# Patient Record
Sex: Female | Born: 1975 | Race: White | Hispanic: No | Marital: Married | Smoking: Never smoker
Health system: Southern US, Community
[De-identification: ages and names within clinical notes are randomized; demographics above are authoritative.]

## PROBLEM LIST (undated history)

## (undated) DIAGNOSIS — F32A Depression, unspecified: Secondary | ICD-10-CM

## (undated) DIAGNOSIS — K589 Irritable bowel syndrome without diarrhea: Secondary | ICD-10-CM

## (undated) DIAGNOSIS — T4145XA Adverse effect of unspecified anesthetic, initial encounter: Secondary | ICD-10-CM

## (undated) DIAGNOSIS — F988 Other specified behavioral and emotional disorders with onset usually occurring in childhood and adolescence: Secondary | ICD-10-CM

## (undated) DIAGNOSIS — F329 Major depressive disorder, single episode, unspecified: Secondary | ICD-10-CM

## (undated) DIAGNOSIS — N649 Disorder of breast, unspecified: Secondary | ICD-10-CM

## (undated) DIAGNOSIS — T8859XA Other complications of anesthesia, initial encounter: Secondary | ICD-10-CM

## (undated) DIAGNOSIS — G43909 Migraine, unspecified, not intractable, without status migrainosus: Secondary | ICD-10-CM

## (undated) DIAGNOSIS — Z98811 Dental restoration status: Secondary | ICD-10-CM

## (undated) HISTORY — PX: BREAST EXCISIONAL BIOPSY: SUR124

## (undated) HISTORY — PX: POLYPECTOMY: SHX149

## (undated) HISTORY — DX: Irritable bowel syndrome, unspecified: K58.9

## (undated) HISTORY — DX: Migraine, unspecified, not intractable, without status migrainosus: G43.909

---

## 2001-07-04 ENCOUNTER — Other Ambulatory Visit: Admission: RE | Admit: 2001-07-04 | Discharge: 2001-07-04 | Payer: Self-pay | Admitting: Gynecology

## 2001-10-18 ENCOUNTER — Ambulatory Visit (HOSPITAL_COMMUNITY): Admission: RE | Admit: 2001-10-18 | Discharge: 2001-10-18 | Payer: Self-pay | Admitting: Gastroenterology

## 2001-11-02 ENCOUNTER — Other Ambulatory Visit: Admission: RE | Admit: 2001-11-02 | Discharge: 2001-11-02 | Payer: Self-pay | Admitting: Gynecology

## 2002-05-01 ENCOUNTER — Other Ambulatory Visit: Admission: RE | Admit: 2002-05-01 | Discharge: 2002-05-01 | Payer: Self-pay | Admitting: Gynecology

## 2002-09-20 ENCOUNTER — Ambulatory Visit (HOSPITAL_COMMUNITY): Admission: RE | Admit: 2002-09-20 | Discharge: 2002-09-20 | Payer: Self-pay | Admitting: Neurosurgery

## 2002-09-25 ENCOUNTER — Ambulatory Visit (HOSPITAL_COMMUNITY): Admission: RE | Admit: 2002-09-25 | Discharge: 2002-09-25 | Payer: Self-pay | Admitting: Neurosurgery

## 2002-11-20 ENCOUNTER — Other Ambulatory Visit: Admission: RE | Admit: 2002-11-20 | Discharge: 2002-11-20 | Payer: Self-pay | Admitting: Gynecology

## 2003-12-25 ENCOUNTER — Other Ambulatory Visit: Admission: RE | Admit: 2003-12-25 | Discharge: 2003-12-25 | Payer: Self-pay | Admitting: Gynecology

## 2004-12-17 ENCOUNTER — Other Ambulatory Visit: Admission: RE | Admit: 2004-12-17 | Discharge: 2004-12-17 | Payer: Self-pay | Admitting: Gynecology

## 2005-12-23 ENCOUNTER — Other Ambulatory Visit: Admission: RE | Admit: 2005-12-23 | Discharge: 2005-12-23 | Payer: Self-pay | Admitting: Gynecology

## 2006-05-06 ENCOUNTER — Ambulatory Visit (HOSPITAL_COMMUNITY): Admission: RE | Admit: 2006-05-06 | Discharge: 2006-05-06 | Payer: Self-pay | Admitting: Gynecology

## 2007-02-17 ENCOUNTER — Inpatient Hospital Stay (HOSPITAL_COMMUNITY): Admission: AD | Admit: 2007-02-17 | Discharge: 2007-02-27 | Payer: Self-pay | Admitting: Obstetrics and Gynecology

## 2007-02-18 ENCOUNTER — Encounter: Payer: Self-pay | Admitting: Student

## 2007-02-21 ENCOUNTER — Encounter: Payer: Self-pay | Admitting: Obstetrics and Gynecology

## 2007-02-22 ENCOUNTER — Encounter: Payer: Self-pay | Admitting: Obstetrics and Gynecology

## 2007-02-23 ENCOUNTER — Encounter: Payer: Self-pay | Admitting: Obstetrics and Gynecology

## 2007-02-24 ENCOUNTER — Encounter: Payer: Self-pay | Admitting: Obstetrics and Gynecology

## 2007-02-25 ENCOUNTER — Encounter (INDEPENDENT_AMBULATORY_CARE_PROVIDER_SITE_OTHER): Payer: Self-pay | Admitting: Specialist

## 2007-02-28 ENCOUNTER — Encounter: Admission: RE | Admit: 2007-02-28 | Discharge: 2007-03-23 | Payer: Self-pay | Admitting: Obstetrics and Gynecology

## 2008-11-16 HISTORY — PX: APPENDECTOMY: SHX54

## 2010-01-24 ENCOUNTER — Encounter: Payer: Self-pay | Admitting: Obstetrics and Gynecology

## 2010-01-24 ENCOUNTER — Observation Stay (HOSPITAL_COMMUNITY): Admission: EM | Admit: 2010-01-24 | Discharge: 2010-01-25 | Payer: Self-pay | Admitting: Surgery

## 2010-01-24 ENCOUNTER — Encounter (INDEPENDENT_AMBULATORY_CARE_PROVIDER_SITE_OTHER): Payer: Self-pay | Admitting: Surgery

## 2010-01-24 HISTORY — PX: LAPAROSCOPIC APPENDECTOMY: SHX408

## 2010-11-16 HISTORY — PX: COLONOSCOPY: SHX174

## 2010-12-07 ENCOUNTER — Encounter: Payer: Self-pay | Admitting: Obstetrics and Gynecology

## 2011-02-08 LAB — DIFFERENTIAL
Basophils Absolute: 0 10*3/uL (ref 0.0–0.1)
Basophils Relative: 0 % (ref 0–1)
Eosinophils Absolute: 0.1 10*3/uL (ref 0.0–0.7)
Eosinophils Relative: 1 % (ref 0–5)
Lymphocytes Relative: 12 % (ref 12–46)
Monocytes Absolute: 0.4 10*3/uL (ref 0.1–1.0)

## 2011-02-08 LAB — COMPREHENSIVE METABOLIC PANEL
Albumin: 4 g/dL (ref 3.5–5.2)
Alkaline Phosphatase: 54 U/L (ref 39–117)
BUN: 12 mg/dL (ref 6–23)
Calcium: 10 mg/dL (ref 8.4–10.5)
Potassium: 4.9 mEq/L (ref 3.5–5.1)
Sodium: 138 mEq/L (ref 135–145)
Total Protein: 6.8 g/dL (ref 6.0–8.3)

## 2011-02-08 LAB — CBC
HCT: 40.3 % (ref 36.0–46.0)
MCHC: 33.5 g/dL (ref 30.0–36.0)
Platelets: 303 10*3/uL (ref 150–400)
RDW: 12.1 % (ref 11.5–15.5)

## 2011-02-09 LAB — CBC
Hemoglobin: 12.6 g/dL (ref 12.0–15.0)
MCHC: 33.5 g/dL (ref 30.0–36.0)
Platelets: 251 10*3/uL (ref 150–400)
RDW: 12.3 % (ref 11.5–15.5)

## 2011-04-03 NOTE — Discharge Summary (Signed)
NAMEOVIDA, DELAGARZA               ACCOUNT NO.:  [id number]   MEDICAL RECORD NO.:  [id number]          PATIENT TYPE:  INP   LOCATION:  9317                          [facility]ACILITY:  [facility]   PHYSICIAN:  Juluis Mire, M.D.   DATE OF BIRTH:  [birth year]   DATE OF ADMISSION:  02/17/2007  DATE OF DISCHARGE:  02/27/2007                               DISCHARGE SUMMARY   ADMITTING DIAGNOSES:  1. Intrauterine pregnancy at 33-5/7 weeks estimated gestational age.  2. Marginal placenta previa with bleeding.  3. Intrauterine growth retardation.   DISCHARGE DIAGNOSES:  1. Status post low transverse cesarean section.  2. Viable female infant.   PROCEDURE:  Primary low transverse cesarean section.   REASON FOR ADMISSION:  Please see dictated H&P.   HOSPITAL COURSE:  The patient was a 36 year old white married female  primigravida that was admitted to [facility], with a  marginal placenta previa which was confirmed by ultrasound.  The patient  had had several ultrasounds following fetal growth.  It was noted at the  time of admission that fetal growth was rapidly following, now  approximately in the 6th percentile.  Mid-cerebral Doppler flow study an  umbilical or artery Doppler flow were normal, but range was decreasing.  Due to constellation of vaginal bleeding and IUGR, the patient was now  admitted for further observation, modified bed rest, steroid  administration and possible time delivery.  After several days of close  observation, amniocentesis was performed which revealed a mature LS  ratio, and PG was found.  Decision was made to proceed with a primary  low transverse cesarean section.  The patient was then transferred to  operating room where spinal anesthesia was administered without  difficulty.  A low transverse incision was made with delivery of a  viable female infant weighing 3 pounds 12 ounces, with Apgars of 8 at 5  minute.  Arterial cord pH was 7.35.  The patient  tolerated the procedure  well and was taken to the recovery room in stable condition.  On  postoperative day #1, the patient was without complaint.  Vital signs  were stable.  She is afebrile.  Fundus was firm and nontender.  Foley  had been discontinued.  She was voiding well.   LABORATORY FINDINGS:  Revealed hemoglobin of 10.3.  On postoperative day  #2, the patient was without complaint.   PHYSICAL EXAMINATION:  VITAL SIGNS:  Stable.  She was afebrile.  ABDOMEN:  Soft with good return of bowel function.  Fundus was firm and  nontender.  Incision was clean, dry and intact.   DISCHARGE INSTRUCTIONS:  Reviewed, and the patient was later discharged  home.   CONDITION ON DISCHARGE:  Stable.   DIET:  Regular as tolerated.   ACTIVITY:  No heavy lifting, no driving x2 weeks, no vaginal entry.   FOLLOW UP:  Patient to follow up in the office in 1 week for an incision  check.  She is to call for temperature greater than 100 degrees,  persistent nausea, vomiting, heavy vaginal bleeding and/or redness or  drainage from the incisional  site.   DISCHARGE MEDICATION:  1. Tylox x 30, one p.o. 4-6 hours.  2. Motrin 600 mg every 6 hours.  3. Prenatal vitamins one p.o. daily.  4. Colace one p.o. daily p.r.n.      Julio Sicks, N.P.      Juluis Mire, M.D.  Electronically Signed    CC/MEDQ  D:  04/01/2007  T:  04/01/2007  Job:  [id number]

## 2011-04-03 NOTE — H&P (Signed)
Denise [name], GORANSON NO.:  [id number]   MEDICAL RECORD NO.:  [id number]          PATIENT TYPE:  MAT   LOCATION:  MATC                          [facility]ILITY:  [facility]   PHYSICIAN:  Freddy Finner, M.D.   DATE OF BIRTH:  May 31, 1976   DATE OF ADMISSION:  02/17/2007  DATE OF DISCHARGE:                              HISTORY & PHYSICAL   ADMITTING DIAGNOSES:  1. Intrauterine pregnancy at 33-5/7th's weeks' gestation.  2. Marginal placenta previa with bleeding.   The patient is a 35 year old, white, married female, Primigravida, who  has been known to have a marginal placenta previa which persists through  the day of admission and is confirmed by ultrasound examination.  She  has had serial ultrasounds following fetal growth and fetal growth is  rapidly falling and has now reached the sixth percentile.  Middle  cerebral artery Doppler flow and umbilical artery Doppler flow are still  in the normal range but barely so.  Based on the constellation of the  vaginal bleeding and the fetal findings, it is elected to admit her now  for modified bed rest, careful fetal monitoring, steroid administration,  and timed delivery in 48-72 hours.   REVIEW OF SYSTEMS:  Negative except as noted above.  Her pregnancy has  been otherwise uncomplicated.   PAST MEDICAL HISTORY:  Outlined in detail in the prenatal summary.   PHYSICAL EXAMINATION:  GENERAL:  The patient is a well nourished, well  developed, young, white female in no acute distress at the time of  examination.  VITAL SIGNS:  Blood pressure in the office is 98/64.  NECK:  Thyroid gland is not palpably enlarged.  HEENT:  Grossly within normal limits.  CHEST:  Clear to auscultation.  HEART:  Normal sinus rhythm.  There is an audible early systolic murmur  at the second left intercostal space.  It is a grade 2-3/6 and has a  blowing character to auscultation.  Murmurs were not heard in any other  areas nor are there rubs or  gallops.  ABDOMEN:  Gravid and appropriate for gestational age with fundal height  34-cm.  EXTREMITIES:  Without cyanosis, clubbing, or edema.  PELVIC:  Deferred.   Ultrasound obtained in the office today essentially has the findings  noted in present illness.  The amniotic fluid index is in the 35th  percentile.  The baby's weight of 1576 grams is in the 6th percentile.  The baby did get 8 of 8 on a biophysical profile and a non-stress test  in the office and was reactive.   ASSESSMENT:  1. Intrauterine pregnancy.  2. Marginal placenta previa with bleeding.  3. Estimated gestational age of 54 and 5.   PLAN:  Again, as outlined above, i.e., admission for modified bedrest  for careful fetal monitoring for steroid administration with timed  delivery by cesarean.      Freddy Finner, M.D.  Electronically Signed     WRN/MEDQ  D:  02/17/2007  T:  02/17/2007  Job:  [id number]

## 2011-04-03 NOTE — Op Note (Signed)
[name], [name]               ACCOUNT NO.:  [id number]   MEDICAL RECORD NO.:  [id number]          PATIENT TYPE:  INP   LOCATION:  9155                          [facility]ILITY:  [facility]   PHYSICIAN:  Juluis Mire, M.D.   DATE OF BIRTH:  23-Oct-1976   DATE OF PROCEDURE:  02/25/2007  DATE OF DISCHARGE:                               OPERATIVE REPORT   PREOPERATIVE DIAGNOSES:  1. Intrauterine pregnancy at 34-6/7.  2. Intrauterine growth retardation.  3. Placenta previa.  Mature fetal pulmonary studies.   POSTOPERATIVE DIAGNOSES:  1. Intrauterine pregnancy at 34-6/7.  2. Intrauterine growth retardation.  3. Placenta previa.  Mature fetal pulmonary studies.   OPERATIVE PROCEDURE:  Primary low transverse cesarean section.   SURGEON:  Juluis Mire, M.D.   ANESTHESIA:  Was spinal.   ESTIMATED BLOOD LOSS:  600 mL.   PACKS AND DRAINS:  Were none.   INTRAOPERATIVE BLOOD REPLACED:  None.   COMPLICATIONS:  Were none.   INDICATIONS:  The patient is a 35 year old primigravida female of the  above gestational age.  She was hospitalized with evidence of  intrauterine growth retardation and a placenta previa.  She had an  amniocentesis performed yesterday with mature LS and PG and now presents  for primary cesarean section in view of the above-noted complications.  The risks were explained including the risk of infection.  The risk of  hemorrhage that could require transfusion with the risk of AIDS or  hepatitis.  Risk of injury to adjacent organs including bladder, bowel  or ureters that could require further exploratory surgery.  Risk of deep  venous thrombosis and pulmonary emboli.  The patient expressed  understanding of indications and risks.   PROCEDURE AS FOLLOWS:  The patient was taken to the OR and placed in the  supine position with the left lateral tilt.  After satisfactory level of  spinal anesthesia was obtained, the abdomen was prepped out with  Betadine and draped as a  sterile field.  A low transverse skin incision  was made with a knife and carried through the subcutaneous tissue.  Anterior rectus fascia was then sharply incised and the fascia extended  laterally.  Fascia was taken off the muscle superiorly and inferiorly.  Rectus muscles were separated in the midline.  The perineum was entered  sharply and incision of perineum extended both superiorly and  inferiorly.  Low transverse bladder flap was developed.  A low  transverse uterine incision was begun with a knife and extended  laterally using manual traction.  Amniotic fluid was clear.  There was  an anterior placenta previa.  Infant was delivered.  It was a viable  female.  Weight is pending.  Apgars were 8/8.  Umbilical artery pH was  7.35.  Placenta was delivered manually and sent for pathological review.  Uterus was exteriorized for closure.  There was no uterine anomalies.  Tubes and ovaries were unremarkable.  Uterus was then closed with a  running locking suture of zero chromic using a two-layer closure  technique.  We had good hemostasis and clear urine output.  The uterus  was returned to the abdominal cavity.  Pelvic cavity was irrigated.  Again, we had good hemostasis.  Muscles and peritoneum closed with a  running suture of 3-0 Vicryl.  Fascia closed with a suture of 0 PDS.  Skin was closed staples and Steri-Strips.  Sponge, instrument and needle  count reported as correct by circulating nurse x2.  Foley catheter  remained clear at time of closure.  The patient tolerated the procedure  and returned to recovery room in good condition.      Juluis Mire, M.D.  Electronically Signed     JSM/MEDQ  D:  02/25/2007  T:  02/25/2007  Job:  [id number]

## 2011-05-13 ENCOUNTER — Encounter: Payer: Self-pay | Admitting: Gastroenterology

## 2011-06-29 ENCOUNTER — Encounter: Payer: Self-pay | Admitting: Gastroenterology

## 2011-06-29 ENCOUNTER — Ambulatory Visit (INDEPENDENT_AMBULATORY_CARE_PROVIDER_SITE_OTHER): Payer: 59 | Admitting: Gastroenterology

## 2011-06-29 VITALS — BP 112/76 | HR 72 | Ht 67.0 in | Wt 192.2 lb

## 2011-06-29 DIAGNOSIS — K589 Irritable bowel syndrome without diarrhea: Secondary | ICD-10-CM

## 2011-06-29 DIAGNOSIS — R198 Other specified symptoms and signs involving the digestive system and abdomen: Secondary | ICD-10-CM

## 2011-06-29 MED ORDER — PEG-KCL-NACL-NASULF-NA ASC-C 100 G PO SOLR: 1.0000 | Freq: Once | ORAL | Status: DC

## 2011-06-29 NOTE — Patient Instructions (Signed)
You have been scheduled for a Colonoscopy with propofol. Separate instructions given.  Pick up your prep from your pharmacy.  High Fiber diet given.  cc: Creola Corn, MD

## 2011-06-29 NOTE — Progress Notes (Addendum)
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 She states she had a normal colonoscopy performed over 10 years ago as well. She notes that on her times of stress she can and will have urgent watery, nonbloody diarrhea associated with lower abdominal bloating and cramping. Since earlier this summer she has had a new symptom of constipation alternating with urgent diarrhea with similar lower abdominal bloating and pain. She tried fiber supplements with no change in symptoms. Her adopted sister passed away in 24-Sep-2023 at age 23 from breast cancer. She denies hematochezia, melena, weight loss, nausea, vomiting, upper abdominal pain, fevers, chills, rashes, joint symptoms. She was previously evaluated by Dr. Kinnie Scales. She last saw him in 2009. Blood work performed earlier this month and Dr. Ferd Hibbs office showed elevated cholesterol and triglycerides. Her CBC, chemistry panel, urinalysis and TSH were unremarkable  Past Medical History  Diagnosis Date  . IBS (irritable bowel syndrome)   . ADD (attention deficit disorder with hyperactivity)   . Anxiety   . Hyperlipidemia   . Anorectal fissure   . Overweight   . Migraines   . Hypertension    Past Surgical History  Procedure Date  . Cesarean section   . Appendectomy     reports that she has never smoked. She has never used smokeless tobacco. She reports that she drinks alcohol. She reports that she does not use illicit drugs. family history includes Aneurysm in her paternal uncle; Cerebral aneurysm in her maternal grandfather and mother; Heart disease in her maternal grandfather; Nephrolithiasis in her father; and Osteoarthritis in her mother. No Known Allergies  Outpatient Encounter Prescriptions as of 06/29/2011  Medication Sig Dispense Refill  . DULoxetine (CYMBALTA) 60 MG capsule Take 60 mg by mouth daily.        . fluticasone (FLONASE) 50  MCG/ACT nasal spray Place 2 sprays into the nose daily.        Marland Kitchen lamoTRIgine (LAMICTAL) 200 MG tablet Take 200 mg by mouth daily.        Marland Kitchen lisdexamfetamine (VYVANSE) 60 MG capsule Take 60 mg by mouth every morning.        . Norethindrone Acet-Ethinyl Est (LOESTRIN 1/20, 21, PO) Take by mouth as directed.        . peg 3350 powder (MOVIPREP) 100 G SOLR Take 1 kit (100 g total) by mouth once.  1 kit  0  . DISCONTD: clonazePAM (KLONOPIN) 0.5 MG tablet Take 0.5 mg by mouth as needed.           Review of Systems: Pertinent positive and negative review of systems were noted in the above HPI section. All other review of systems were otherwise negative.  Physical Exam: General: Well developed , well nourished, no acute distress Head: Normocephalic and atraumatic Eyes:  sclerae anicteric, EOMI Ears: Normal auditory acuity Mouth: No deformity or lesions Neck: Supple, no masses or thyromegaly Lungs: Clear throughout to auscultation Heart: Regular rate and rhythm; no murmurs, rubs or bruits Abdomen: Soft, non tender and non distended. No masses, hepatosplenomegaly or hernias noted. Normal Bowel sounds Rectal: Deferred to colonoscopy Musculoskeletal: Symmetrical with no gross deformities  Skin: No lesions on visible extremities Pulses:  Normal pulses noted Extremities: No clubbing, cyanosis, edema or deformities noted Neurological: Alert oriented x 4, grossly nonfocal Cervical Nodes:  No significant cervical adenopathy Inguinal Nodes: No significant inguinal adenopathy Psychological:  Alert and cooperative. Normal mood and affect  Assessment and Recommendations:  1. Change in bowel habits with alternating diarrhea and constipation in the setting of a history of diarrhea predominant irritable bowel syndrome. I suspect this is a new phase of her irritable bowel syndrome however colitis and colorectal neoplasms need to be excluded with colonoscopy. Increase dietary fiber and water intake. Daily fiber  supplement. MiraLax once or twice daily titrated to avoid constipation. The risks, benefits, and alternatives to colonoscopy with possible biopsy and possible polypectomy were discussed with the patient and they consent to proceed. Consider the addition of a probiotic and/or anti-spasmodic if needed. Will use propofol sedation given use of a Adderall, Cymbalta and Vyvance.

## 2011-07-01 ENCOUNTER — Ambulatory Visit (AMBULATORY_SURGERY_CENTER): Payer: 59 | Admitting: Gastroenterology

## 2011-07-01 ENCOUNTER — Encounter: Payer: Self-pay | Admitting: Gastroenterology

## 2011-07-01 VITALS — BP 114/7 | HR 66 | Temp 98.9°F | Resp 18 | Ht 67.0 in | Wt 192.0 lb

## 2011-07-01 DIAGNOSIS — D126 Benign neoplasm of colon, unspecified: Secondary | ICD-10-CM

## 2011-07-01 DIAGNOSIS — R198 Other specified symptoms and signs involving the digestive system and abdomen: Secondary | ICD-10-CM

## 2011-07-01 DIAGNOSIS — K589 Irritable bowel syndrome without diarrhea: Secondary | ICD-10-CM

## 2011-07-01 DIAGNOSIS — K648 Other hemorrhoids: Secondary | ICD-10-CM

## 2011-07-01 MED ORDER — SODIUM CHLORIDE 0.9 % IV SOLN: 500.0000 mL | INTRAVENOUS | Status: DC

## 2011-07-01 NOTE — Patient Instructions (Signed)
Discharge instructions given with verbal understanding. Handouts on polyps,hemorrhoids and a high fiber diet given. Resume previous medications. 

## 2011-07-02 ENCOUNTER — Telehealth: Payer: Self-pay | Admitting: *Deleted

## 2011-07-02 NOTE — Telephone Encounter (Signed)

## 2011-07-07 ENCOUNTER — Encounter: Payer: Self-pay | Admitting: Gastroenterology

## 2011-07-29 ENCOUNTER — Encounter: Payer: Self-pay | Admitting: *Deleted

## 2011-08-03 ENCOUNTER — Telehealth: Payer: Self-pay | Admitting: Gastroenterology

## 2011-08-03 ENCOUNTER — Ambulatory Visit: Payer: 59 | Admitting: Gastroenterology

## 2011-08-03 NOTE — Telephone Encounter (Signed)
No charge. 

## 2011-09-08 ENCOUNTER — Ambulatory Visit: Payer: 59 | Admitting: Gastroenterology

## 2013-09-28 ENCOUNTER — Other Ambulatory Visit: Payer: Self-pay | Admitting: Obstetrics and Gynecology

## 2013-09-28 DIAGNOSIS — R922 Inconclusive mammogram: Secondary | ICD-10-CM

## 2013-09-28 DIAGNOSIS — Z803 Family history of malignant neoplasm of breast: Secondary | ICD-10-CM

## 2013-10-14 ENCOUNTER — Ambulatory Visit
Admission: RE | Admit: 2013-10-14 | Discharge: 2013-10-14 | Disposition: A | Discharge status: Home / Self Care | Payer: 59 | Source: Ambulatory Visit | Attending: Obstetrics and Gynecology | Admitting: Obstetrics and Gynecology

## 2013-10-14 DIAGNOSIS — R922 Inconclusive mammogram: Secondary | ICD-10-CM

## 2013-10-14 DIAGNOSIS — Z803 Family history of malignant neoplasm of breast: Secondary | ICD-10-CM

## 2013-10-14 MED ORDER — GADOBENATE DIMEGLUMINE 529 MG/ML IV SOLN
18.0000 mL | Freq: Once | INTRAVENOUS | Status: AC | PRN
  Administered 2013-10-14: 18 mL via INTRAVENOUS

## 2013-10-17 ENCOUNTER — Other Ambulatory Visit: Payer: Self-pay | Admitting: Obstetrics and Gynecology

## 2013-10-17 DIAGNOSIS — R928 Other abnormal and inconclusive findings on diagnostic imaging of breast: Secondary | ICD-10-CM

## 2013-10-18 ENCOUNTER — Ambulatory Visit
Admission: RE | Admit: 2013-10-18 | Discharge: 2013-10-18 | Disposition: A | Discharge status: Home / Self Care | Payer: 59 | Source: Ambulatory Visit | Attending: Obstetrics and Gynecology | Admitting: Obstetrics and Gynecology

## 2013-10-18 ENCOUNTER — Other Ambulatory Visit: Payer: Self-pay | Admitting: Obstetrics and Gynecology

## 2013-10-18 DIAGNOSIS — R928 Other abnormal and inconclusive findings on diagnostic imaging of breast: Secondary | ICD-10-CM

## 2013-10-19 ENCOUNTER — Other Ambulatory Visit: Payer: Self-pay | Admitting: Obstetrics and Gynecology

## 2013-10-19 DIAGNOSIS — R928 Other abnormal and inconclusive findings on diagnostic imaging of breast: Secondary | ICD-10-CM

## 2013-10-24 ENCOUNTER — Ambulatory Visit
Admission: RE | Admit: 2013-10-24 | Discharge: 2013-10-24 | Disposition: A | Discharge status: Home / Self Care | Payer: 59 | Source: Ambulatory Visit | Attending: Obstetrics and Gynecology | Admitting: Obstetrics and Gynecology

## 2013-10-24 DIAGNOSIS — R928 Other abnormal and inconclusive findings on diagnostic imaging of breast: Secondary | ICD-10-CM

## 2013-10-24 MED ORDER — GADOBENATE DIMEGLUMINE 529 MG/ML IV SOLN
18.0000 mL | Freq: Once | INTRAVENOUS | Status: AC | PRN
  Administered 2013-10-24: 18 mL via INTRAVENOUS

## 2014-09-24 ENCOUNTER — Other Ambulatory Visit: Payer: Self-pay | Admitting: Obstetrics and Gynecology

## 2014-09-25 ENCOUNTER — Other Ambulatory Visit: Payer: Self-pay | Admitting: Obstetrics and Gynecology

## 2014-09-25 LAB — CYTOLOGY - PAP

## 2014-10-04 ENCOUNTER — Other Ambulatory Visit: Payer: Self-pay | Admitting: Obstetrics and Gynecology

## 2014-10-04 DIAGNOSIS — Z1231 Encounter for screening mammogram for malignant neoplasm of breast: Secondary | ICD-10-CM

## 2015-12-03 ENCOUNTER — Ambulatory Visit
Admission: RE | Admit: 2015-12-03 | Discharge: 2015-12-03 | Disposition: A | Discharge status: Home / Self Care | Payer: 59 | Source: Ambulatory Visit | Attending: Obstetrics and Gynecology | Admitting: Obstetrics and Gynecology

## 2015-12-03 DIAGNOSIS — Z1231 Encounter for screening mammogram for malignant neoplasm of breast: Secondary | ICD-10-CM

## 2015-12-05 ENCOUNTER — Other Ambulatory Visit: Payer: Self-pay | Admitting: Obstetrics and Gynecology

## 2015-12-05 DIAGNOSIS — Z803 Family history of malignant neoplasm of breast: Secondary | ICD-10-CM

## 2015-12-09 ENCOUNTER — Encounter: Payer: Self-pay | Admitting: Neurology

## 2015-12-09 ENCOUNTER — Ambulatory Visit (INDEPENDENT_AMBULATORY_CARE_PROVIDER_SITE_OTHER): Payer: 59 | Admitting: Neurology

## 2015-12-09 VITALS — BP 159/84 | HR 89 | Ht 67.0 in | Wt 224.6 lb

## 2015-12-09 DIAGNOSIS — H8149 Vertigo of central origin, unspecified ear: Secondary | ICD-10-CM | POA: Diagnosis not present

## 2015-12-09 DIAGNOSIS — H814 Vertigo of central origin: Secondary | ICD-10-CM

## 2015-12-09 DIAGNOSIS — R51 Headache: Secondary | ICD-10-CM

## 2015-12-09 DIAGNOSIS — E669 Obesity, unspecified: Secondary | ICD-10-CM | POA: Insufficient documentation

## 2015-12-09 DIAGNOSIS — R110 Nausea: Secondary | ICD-10-CM | POA: Diagnosis not present

## 2015-12-09 DIAGNOSIS — G43909 Migraine, unspecified, not intractable, without status migrainosus: Secondary | ICD-10-CM | POA: Diagnosis not present

## 2015-12-09 DIAGNOSIS — E785 Hyperlipidemia, unspecified: Secondary | ICD-10-CM

## 2015-12-09 DIAGNOSIS — R42 Dizziness and giddiness: Secondary | ICD-10-CM

## 2015-12-09 DIAGNOSIS — G4489 Other headache syndrome: Secondary | ICD-10-CM

## 2015-12-09 DIAGNOSIS — R2689 Other abnormalities of gait and mobility: Secondary | ICD-10-CM | POA: Insufficient documentation

## 2015-12-09 DIAGNOSIS — H539 Unspecified visual disturbance: Secondary | ICD-10-CM | POA: Diagnosis not present

## 2015-12-09 DIAGNOSIS — R519 Headache, unspecified: Secondary | ICD-10-CM | POA: Insufficient documentation

## 2015-12-09 MED ORDER — ONDANSETRON 4 MG PO TBDP: 4.0000 mg | ORAL_TABLET | Freq: Three times a day (TID) | ORAL | Status: DC | PRN

## 2015-12-09 MED ORDER — SUMATRIPTAN SUCCINATE 11 MG/NOSEPC NA EXHP: 2.0000 | INHALANT_POWDER | Freq: Once | NASAL | Status: AC

## 2015-12-09 NOTE — Progress Notes (Signed)
Faxed onzetra enrollment form plus rx onzetra to [facility]. Received confirmation. Fax: [phone]. Sent copy to MR.

## 2015-12-09 NOTE — Patient Instructions (Addendum)
Overall you are doing fairly well but I do want to suggest a few things today:   Remember to drink plenty of fluid, eat healthy meals and do not skip any meals. Try to eat protein with a every meal and eat a healthy snack such as fruit or nuts in between meals. Try to keep a regular sleep-wake schedule and try to exercise daily, particularly in the form of walking, 20-30 minutes a day, if you can.   As far as your medications are concerned, I would like to suggest:  Onzetra at inset if headache or vertigo. May repeat in 2 hours. Zofran as needed for nausea and vertigo  As far as diagnostic testing: MRi of the brain  Our phone number is [phone]. We also have an after hours call service for urgent matters and there is a physician on-call for urgent questions. For any emergencies you know to call 911 or go to the nearest emergency room

## 2015-12-09 NOTE — Progress Notes (Signed)
[facility]    Provider:  Dr Jaynee Eagles Referring Provider: Arvella Nigh Primary Care Physician:  Precious Reel, MD  CC:  Migraines and vertigo  HPI:  Denise [name] is a 40 y.o. female here as a referral from Dr. Virgina Jock for vertigo and migraines. PMHx IBS, ADD, anxiety, HLD, obesity, HTN. Migraines started years ago. She has had 1-2 in the last 3-4 months. They are not that often. But she has significant vision changes, she loses her peripheral vision. She started having vertigo lately. She has a FHx of brain aneurysm. Mother was hospitalized for 6 weeks after a ruptrue. Grandfather also had a stroke, dad's father and dad's brother had a stroke at a young age. She had an MRI of the brain 10 years ago and they looked for aneurysm and she had a cerebral angiorgram which was negative. Vertigo started in November and is worsening. Was coming and going, wasn't related to menstrual cycle like her migraines are. Would last a few hours, room spinning and nausea. Could be anytime of the day. Unknown if associated with head movements but she doesn't think so. At [place] she all of sudden felt like she was going to topple over, everything went spinning, nausea without associated headache. No preceding viruses or illnesses or ear infections prior to onset. She lays down and goes to sleep which helps, quiet room, has to sit still, if she moves it spins. No decreased hearing in the ears or fullness in the ears.  She is having it every other week. Eyes will feel like they are moving with vision changes, she feels tired afterwards and nauseated. No known triggers. Blood pressure today is a little high but usually normal. No Hx of HTN, diabetes, smoking, alcohol. She has some elevated cholesterol and she just started a diet. No new medications. She feels nystagmus in the eyes.   Reviewed notes, labs and imaging from outside physicians, which showed:  IR Angiogram 2003: 1. NO ANGIOGRAPHIC EVIDENCE OF  INTRACRANIAL OR EXTRACRANIAL ANEURYSMS. 2. NO EVIDENCE OF DISSECTIONS, VASOSPASM, OCCLUSIONS, OR STENOSES NOTED EITHER. 3. NO EVIDENCE DURAL ARTERIOVENOUS MALFORMATIONS OR ARTERIOVENOUS MALFORMATIONS.  Reviewed records from obgyn. 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 Has Hx of migraines with aura. Also with IBS. Husband had vasectomy. She was referred to neurology for vertigo.   Last cmp I have available in 2011 is normal except for elevated glucose 148.  Review of Systems: Patient complains of symptoms per HPI as well as the following symptoms:headache, dizziness, depression . Pertinent negatives per HPI. All others negative.   Social History   Social History  . Marital Status: Married    Spouse Name: N/A  . Number of Children: 1  . Years of Education: N/A   Occupational History  . Child psychotherapist @ [employer]    Social History Main Topics  . Smoking status: Never Smoker   . Smokeless tobacco: Never Used  . Alcohol Use: Yes     Comment: 1 glass of wine daily  . Drug Use: No  . Sexual Activity: Not on file   Other Topics Concern  . Not on file   Social History Narrative  . No narrative on file    Family History  Problem Relation Age of Onset  . Nephrolithiasis Father   . Cerebral aneurysm Mother   . Osteoarthritis Mother   . Cerebral aneurysm Maternal Grandfather   . Heart disease Maternal Grandfather   .  Aneurysm Paternal Uncle   . Colon cancer Neg Hx     Past Medical History  Diagnosis Date  . IBS (irritable bowel syndrome)   . ADD (attention deficit disorder with hyperactivity)   . Anxiety   . Hyperlipidemia   . Anorectal fissure   . Overweight   . Migraines   . Hypertension   . Personal history of colonic polyps 07/01/2011    SERRATED ADENOMA  . Internal hemorrhoids     Past Surgical History  Procedure Laterality Date  . Cesarean section    . Appendectomy      Current Outpatient  Prescriptions  Medication Sig Dispense Refill  . DULoxetine (CYMBALTA) 60 MG capsule Take 30 mg by mouth daily.     Marland Kitchen lamoTRIgine (LAMICTAL) 200 MG tablet Take 200 mg by mouth daily.      . Norethindrone Acet-Ethinyl Est (LOESTRIN 1/20, 21, PO) Take by mouth as directed.       No current facility-administered medications for this visit.    Allergies as of 12/09/2015  . (No Known Allergies)    Vitals: BP 159/84 mmHg  Pulse 89  Ht 5\' 7"  (1.702 m)  Wt 224 lb 9.6 oz (101.878 kg)  BMI 35.17 kg/m2 Last Weight:  Wt Readings from Last 1 Encounters:  12/09/15 224 lb 9.6 oz (101.878 kg)   Last Height:   Ht Readings from Last 1 Encounters:  12/09/15 5\' 7"  (1.702 m)     Physical exam: Exam: Gen: NAD, conversant, well nourised, obese, well groomed                     CV: RRR, no MRG. No Carotid Bruits. No peripheral edema, warm, nontender Eyes: Conjunctivae clear without exudates or hemorrhage  Neuro: Detailed Neurologic Exam  Speech:    Speech is normal; fluent and spontaneous with normal comprehension.  Cognition:    The patient is oriented to person, place, and time;     recent and remote memory intact;     language fluent;     normal attention, concentration,     fund of knowledge Cranial Nerves:    The pupils are equal, round, and reactive to light. The fundi are normal and spontaneous venous pulsations are present. Visual fields are full to finger confrontation. Extraocular movements are intact. Trigeminal sensation is intact and the muscles of mastication are normal. The face is symmetric. The palate elevates in the midline. Hearing intact. Voice is normal. Shoulder shrug is normal. The tongue has normal motion without fasciculations.   Coordination:    Normal finger to nose and heel to shin. Normal rapid alternating movements.   Gait:    Heel-toe and tandem gait are normal.   Motor Observation:    No asymmetry, no atrophy, and no involuntary movements  noted. Tone:    Normal muscle tone.    Posture:    Posture is normal. normal erect    Strength:    Strength is V/V in the upper and lower limbs.      Sensation: intact to LT     Reflex Exam:  DTR's:    Deep tendon reflexes in the upper and lower extremities are normal bilaterally.   Toes:    The toes are downgoing bilaterally.   Clonus:    Clonus is absent.     Assessment/Plan:   40 y.o. female here as a referral from Dr. Virgina Jock for migraines and vertigo. PMHx IBS, ADD, anxiety, HLD, obesity, HTN. Migraines started years  ago. She has had 1-2 in the last 3-4 months. They are not that often. But she has significant vision changes, she loses her peripheral vision. She started having vertigo as well, worsening, not with movements of the head, sometimes associated with headache. May consider vestibular migraine.  Need to rule out central cause of vertigo or a vestibular schwannoma, cerebellar infarct and for pathologies affecting the brainstem  MRi of the brain CMP Will start Onzetra(triptan) for acute management of migraines and vertiginous episodes. May repeat in 2 hours. The most common side-effects are feeling sick (nausea), dizziness and dry mouth. In addition, triptans can also cause some people to experience strange sensations. These may be a tightness, tingling, flushing, and feelings of heaviness or pressure in areas such as the face and limbs, and occasionally the chest. Serious side effects can include stroke, cardiac side effects such as chest tightness, shortness of breath and possible cardiovascular adverse effects. Zofran for nausea  CC: Dr. Radene Knee and Dr. Baird Cancer, [facility] [address] [place],  [address]  Phone [phone] Fax [phone]

## 2015-12-16 ENCOUNTER — Encounter: Payer: Self-pay | Admitting: *Deleted

## 2015-12-16 NOTE — Progress Notes (Signed)
Faxed recent office notes per request from [facility] support) to process PA for insurance reasons. Received confirmation.

## 2015-12-17 ENCOUNTER — Telehealth: Payer: Self-pay | Admitting: Neurology

## 2015-12-17 DIAGNOSIS — F40240 Claustrophobia: Secondary | ICD-10-CM

## 2015-12-17 MED ORDER — ALPRAZOLAM 0.5 MG PO TABS: ORAL_TABLET | ORAL | Status: DC

## 2015-12-17 NOTE — Telephone Encounter (Signed)
LVM returning call from pt. Asked her to call back so I can verify her allergies. Advised we typically give xanax 0.5mg  (3tablets). She can take 1-2 tablets prior to scan and take additional dose at time of test if needed. Gave [facility] phone number.

## 2015-12-17 NOTE — Telephone Encounter (Signed)
Pt called and wants to know if she could get a one time dose of an antianxiety medication. She is going to have and MRI and a breast scan done on the same day, Monday Feb 6th. Please send to [facility] on [address]. Please call and advise [phone]

## 2015-12-17 NOTE — Telephone Encounter (Signed)
Faxed printed rx to pharmacy. Received confirmation.

## 2015-12-19 NOTE — Addendum Note (Signed)
Addended by: Rossie Muskrat L on: 12/19/2015 12:20 PM   Modules accepted: Medications

## 2015-12-19 NOTE — Progress Notes (Signed)
Notice of approval received from [facility] for onzetra. Approval for 12/17/15-06/15/16.

## 2015-12-20 ENCOUNTER — Other Ambulatory Visit: Payer: 59

## 2015-12-23 ENCOUNTER — Inpatient Hospital Stay: Admission: RE | Admit: 2015-12-23 | Payer: 59 | Source: Ambulatory Visit

## 2015-12-23 ENCOUNTER — Other Ambulatory Visit: Payer: 59

## 2015-12-24 ENCOUNTER — Ambulatory Visit
Admission: RE | Admit: 2015-12-24 | Discharge: 2015-12-24 | Disposition: A | Discharge status: Home / Self Care | Payer: 59 | Source: Ambulatory Visit | Attending: Obstetrics and Gynecology | Admitting: Obstetrics and Gynecology

## 2015-12-24 ENCOUNTER — Ambulatory Visit
Admission: RE | Admit: 2015-12-24 | Discharge: 2015-12-24 | Disposition: A | Discharge status: Home / Self Care | Payer: 59 | Source: Ambulatory Visit | Attending: Neurology | Admitting: Neurology

## 2015-12-24 DIAGNOSIS — G4489 Other headache syndrome: Secondary | ICD-10-CM

## 2015-12-24 DIAGNOSIS — R2689 Other abnormalities of gait and mobility: Secondary | ICD-10-CM

## 2015-12-24 DIAGNOSIS — H814 Vertigo of central origin: Secondary | ICD-10-CM

## 2015-12-24 DIAGNOSIS — E785 Hyperlipidemia, unspecified: Secondary | ICD-10-CM

## 2015-12-24 DIAGNOSIS — E669 Obesity, unspecified: Secondary | ICD-10-CM

## 2015-12-24 DIAGNOSIS — R42 Dizziness and giddiness: Secondary | ICD-10-CM

## 2015-12-24 DIAGNOSIS — Z803 Family history of malignant neoplasm of breast: Secondary | ICD-10-CM

## 2015-12-24 DIAGNOSIS — H539 Unspecified visual disturbance: Secondary | ICD-10-CM

## 2015-12-24 MED ORDER — GADOBENATE DIMEGLUMINE 529 MG/ML IV SOLN
20.0000 mL | Freq: Once | INTRAVENOUS | Status: AC | PRN
  Administered 2015-12-24: 20 mL via INTRAVENOUS

## 2015-12-24 MED ORDER — GADOBENATE DIMEGLUMINE 529 MG/ML IV SOLN
20.0000 mL | Freq: Once | INTRAVENOUS | Status: DC | PRN
Start: 2015-12-24 — End: 2015-12-25

## 2015-12-27 ENCOUNTER — Telehealth: Payer: Self-pay | Admitting: *Deleted

## 2015-12-27 NOTE — Telephone Encounter (Signed)
-----   Message from Melvenia Beam, MD sent at 12/27/2015  8:54 AM EST ----- Terrence Dupont, patient's MRI is essential normal. The back of her brain sits just barely a little lower in the skull by 3-24mm. 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 There is no effect on the brain and her brain is normal. Findings this minor are unlikely to cause any symptoms at all. She is welcome to come into the office so I can show her in person thanks.

## 2015-12-27 NOTE — Telephone Encounter (Signed)
LVM for pt to call about results. Gave [facility] phone number and hours.  

## 2015-12-30 ENCOUNTER — Other Ambulatory Visit: Payer: Self-pay | Admitting: Obstetrics and Gynecology

## 2015-12-30 DIAGNOSIS — R928 Other abnormal and inconclusive findings on diagnostic imaging of breast: Secondary | ICD-10-CM

## 2015-12-30 NOTE — Telephone Encounter (Signed)
Called pt and relayed results per Dr Jaynee Eagles note. Pt verbalized understanding. She would like to have an old MRI compared to her new one. Advised she will have to obtain a copy and drop off at our office for one of the providers here to compare imaging. She verbalized understanding. She declined coming in to review images at this time. She is going to wait until May when she has a f/u to go over images.

## 2015-12-30 NOTE — Telephone Encounter (Signed)
Patient is returning your call about her MRI.  Please call.  Thanks!

## 2016-01-02 ENCOUNTER — Ambulatory Visit
Admission: RE | Admit: 2016-01-02 | Discharge: 2016-01-02 | Disposition: A | Discharge status: Home / Self Care | Payer: 59 | Source: Ambulatory Visit | Attending: Obstetrics and Gynecology | Admitting: Obstetrics and Gynecology

## 2016-01-02 ENCOUNTER — Other Ambulatory Visit: Payer: Self-pay | Admitting: Obstetrics and Gynecology

## 2016-01-02 DIAGNOSIS — R928 Other abnormal and inconclusive findings on diagnostic imaging of breast: Secondary | ICD-10-CM

## 2016-01-02 MED ORDER — GADOBENATE DIMEGLUMINE 529 MG/ML IV SOLN
20.0000 mL | Freq: Once | INTRAVENOUS | Status: AC | PRN
  Administered 2016-01-02: 20 mL via INTRAVENOUS

## 2016-03-17 ENCOUNTER — Ambulatory Visit (INDEPENDENT_AMBULATORY_CARE_PROVIDER_SITE_OTHER): Payer: 59 | Admitting: Neurology

## 2016-03-17 VITALS — BP 137/80 | HR 83 | Ht 67.0 in | Wt 201.2 lb

## 2016-03-17 DIAGNOSIS — G43109 Migraine with aura, not intractable, without status migrainosus: Secondary | ICD-10-CM

## 2016-03-17 NOTE — Progress Notes (Signed)
[facility]    Provider:  Dr Jaynee Eagles Referring Provider: Shon Baton, MD Primary Care Physician:  Precious Reel, MD   CC: Migraines and vertigo  Interval history 03/17/2016: CARLON TROLINGER is a 40 y.o. female here as a referral from Dr. Virgina Jock for vertigo and migraines. PMHx IBS, ADD, anxiety, HLD, obesity, HTN. The onzentra worked. She has had 3 events since last being seen. They started with vertigo, dizziness and nausea. She took the [place] and it quickly worked, initially it burned throat but after that no side effects. She has not noticed any triggers. [place]hart diet and she lost 32 pounds.   MRI of the brain was unremarkable 12/24/2015:  No abnormal lesions are seen on diffusion-weighted views to suggest acute ischemia. The cortical sulci, fissures and cisterns are normal in size and appearance. Lateral, third and fourth ventricle are normal in size and appearance. No extra-axial fluid collections are seen. No evidence of mass effect or midline shift. No abnormal lesions are seen on post contrast views.   On sagittal views the posterior fossa, pituitary gland and corpus callosum are unremarkable. Minimal downward cerebellar tonsillar ectopia (3-71mm). No evidence of intracranial hemorrhage on SWI views. The orbits and their contents, paranasal sinuses and calvarium are unremarkable. Intracranial flow voids are present.   IMPRESSION:  Unremarkable MRI brain (with and without). No acute findings.   HPI: TAAHIRA BIANCHINI is a 40 y.o. female here as a referral from Dr. Virgina Jock for vertigo and migraines. PMHx IBS, ADD, anxiety, HLD, obesity, HTN. Migraines started years ago. She has had 1-2 in the last 3-4 months. They are not that often. But she has significant vision changes, she loses her peripheral vision. She started having vertigo lately. She has a FHx of brain aneurysm. Mother was hospitalized for 6 weeks after a ruptrue. Grandfather also had a stroke, dad's father and  dad's brother had a stroke at a young age. She had an MRI of the brain 10 years ago and they looked for aneurysm and she had a cerebral angiorgram which was negative. Vertigo started in November and is worsening. Was coming and going, wasn't related to menstrual cycle like her migraines are. Would last a few hours, room spinning and nausea. Could be anytime of the day. Unknown if associated with head movements but she doesn't think so. At [place] she all of sudden felt like she was going to topple over, everything went spinning, nausea without associated headache. No preceding viruses or illnesses or ear infections prior to onset. She lays down and goes to sleep which helps, quiet room, has to sit still, if she moves it spins. No decreased hearing in the ears or fullness in the ears. She is having it every other week. Eyes will feel like they are moving with vision changes, she feels tired afterwards and nauseated. No known triggers. Blood pressure today is a little high but usually normal. No Hx of HTN, diabetes, smoking, alcohol. She has some elevated cholesterol and she just started a diet. No new medications. She feels nystagmus in the eyes.   Reviewed notes, labs and imaging from outside physicians, which showed:  IR Angiogram 2003: 1. NO ANGIOGRAPHIC EVIDENCE OF INTRACRANIAL OR EXTRACRANIAL ANEURYSMS. 2. NO EVIDENCE OF DISSECTIONS, VASOSPASM, OCCLUSIONS, OR STENOSES NOTED EITHER. 3. NO EVIDENCE DURAL ARTERIOVENOUS MALFORMATIONS OR ARTERIOVENOUS MALFORMATIONS.  Reviewed records from obgyn. 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 Has Hx of migraines with aura. Also with  IBS. Husband had vasectomy. She was referred to neurology for vertigo.   Last cmp I have available in 2011 is normal except for elevated glucose 148.  Review of Systems: Patient complains of symptoms per HPI as well as the following symptoms:headache, dizziness, depression . Pertinent  negatives per HPI. All others negative.     Social History   Social History  . Marital Status: Married    Spouse Name: Eddie Dibbles  . Number of Children: 1  . Years of Education: 16   Occupational History  . [employer]    Social History Main Topics  . Smoking status: Never Smoker   . Smokeless tobacco: Never Used  . Alcohol Use: Yes     Comment: 1 glass of wine daily  . Drug Use: No  . Sexual Activity: Not on file   Other Topics Concern  . Not on file   Social History Narrative   Lives with husband and daughter   Caffeine use: 0-1 per day    Family History  Problem Relation Age of Onset  . Nephrolithiasis Father   . Cerebral aneurysm Mother   . Osteoarthritis Mother   . Cerebral aneurysm Maternal Grandfather   . Heart disease Maternal Grandfather   . Aneurysm Paternal Uncle   . Colon cancer Neg Hx     Past Medical History  Diagnosis Date  . IBS (irritable bowel syndrome)   . ADD (attention deficit disorder with hyperactivity)   . Anxiety   . Hyperlipidemia   . Anorectal fissure   . Overweight(278.02)   . Migraines   . Hypertension   . Personal history of colonic polyps 07/01/2011    SERRATED ADENOMA  . Internal hemorrhoids     Past Surgical History  Procedure Laterality Date  . Cesarean section  2008  . Appendectomy  2010    Current Outpatient Prescriptions  Medication Sig Dispense Refill  . ALPRAZolam (XANAX) 0.5 MG tablet Take 1-2 tablets 30 minutes prior to test. May take additional tablet at time of test if needed. Can cause sedation. Must have someone drive you to and from test. 3 tablet 0  . DULoxetine (CYMBALTA) 60 MG capsule Take 30 mg by mouth daily.     Marland Kitchen lamoTRIgine (LAMICTAL) 200 MG tablet Take 200 mg by mouth daily.      . Methylphenidate HCl ER, LA, (RITALIN LA) 60 MG CP24 Take 1 tablet by mouth daily.    . Norethindrone Acet-Ethinyl Est (LOESTRIN 1/20, 21, PO) Take by mouth as directed.      . ondansetron (ZOFRAN ODT) 4 MG  disintegrating tablet Take 1 tablet (4 mg total) by mouth every 8 (eight) hours as needed for nausea or vomiting. May take 2 at a time. 40 tablet 11  . SUMAtriptan Succinate (ONZETRA XSAIL) 11 MG/NOSEPC EXHP Place 2 sprays into the nose once. 8 each 11   No current facility-administered medications for this visit.    Allergies as of 03/17/2016  . (No Known Allergies)    Vitals: There were no vitals taken for this visit. Last Weight:  Wt Readings from Last 1 Encounters:  12/09/15 224 lb 9.6 oz (101.878 kg)   Last Height:   Ht Readings from Last 1 Encounters:  12/09/15 5\' 7"  (1.702 m)     Speech:  Speech is normal; fluent and spontaneous with normal comprehension.  Cognition:  The patient is oriented to person, place, and time;   recent and remote memory intact;   language fluent;   normal  attention, concentration,   fund of knowledge Cranial Nerves:  The pupils are equal, round, and reactive to light. The fundi are normal and spontaneous venous pulsations are present. Visual fields are full to finger confrontation. Extraocular movements are intact. Trigeminal sensation is intact and the muscles of mastication are normal. The face is symmetric. The palate elevates in the midline. Hearing intact. Voice is normal. Shoulder shrug is normal. The tongue has normal motion without fasciculations.   Coordination:  Normal finger to nose and heel to shin. Normal rapid alternating movements.   Gait:  Heel-toe and tandem gait are normal.   Motor Observation:  No asymmetry, no atrophy, and no involuntary movements noted. Tone:  Normal muscle tone.   Posture:  Posture is normal. normal erect   Strength:  Strength is V/V in the upper and lower limbs.    Sensation: intact to LT   Reflex Exam:  DTR's:  Deep tendon reflexes in the upper and lower extremities are normal bilaterally.  Toes:  The toes are downgoing bilaterally.   Clonus:  Clonus is absent.    Assessment/Plan: 40 y.o. female here as a referral from Dr. Virgina Jock for migraines and vertigo. PMHx IBS, ADD, anxiety, HLD, obesity, HTN. Migraines started years ago. She has had 1-2 in the last 3-4 months. They are not that often. But she has significant vision changes, she loses her peripheral vision. She started having vertigo as well, worsening, not with movements of the head, sometimes associated with headache. May consider vestibular migraine. Need to rule out central cause of vertigo or a vestibular schwannoma, cerebellar infarct and for pathologies affecting the brainstem  MRi of the brain unremarkable Doing great on Onzetra, continue Discussed migraines and the pathophysiology Continue Onzetra(triptan) for acute management of migraines and vertiginous episodes. May repeat in 2 hours. The most common side-effects are feeling sick (nausea), dizziness and dry mouth. In addition, triptans can also cause some people to experience strange sensations. These may be a tightness, tingling, flushing, and feelings of heaviness or pressure in areas such as the face and limbs, and occasionally the chest. Serious side effects can include stroke, cardiac side effects such as chest tightness, shortness of breath and possible cardiovascular adverse effects. Zofran for nausea  CC: Dr. Radene Knee and Dr. Baird Cancer, MD  [facility] [address] [place], [place] [address]  Phone [phone] Fax [phone]  A total of 30 minutes was spent face-to-face with this patient. Over half this time was spent on counseling patient on the migraine with basilar aura diagnosis and different diagnostic and therapeutic options available.

## 2016-03-17 NOTE — Patient Instructions (Signed)
Remember to drink plenty of fluid, eat healthy meals and do not skip any meals. Try to eat protein with a every meal and eat a healthy snack such as fruit or nuts in between meals. Try to keep a regular sleep-wake schedule and try to exercise daily, particularly in the form of walking, 20-30 minutes a day, if you can.   As far as your medications are concerned, I would like to suggest: Continue onzetra  I would like to see you back in 1 year, sooner if we need to. Please call us with any interim questions, concerns, problems, updates or refill requests.   Our phone number is [phone]. We also have an after hours call service for urgent matters and there is a physician on-call for urgent questions. For any emergencies you know to call 911 or go to the nearest emergency room

## 2016-04-07 ENCOUNTER — Encounter: Payer: Self-pay | Admitting: Gastroenterology

## 2016-04-09 ENCOUNTER — Other Ambulatory Visit: Payer: Self-pay | Admitting: General Surgery

## 2016-04-09 DIAGNOSIS — D4862 Neoplasm of uncertain behavior of left breast: Secondary | ICD-10-CM

## 2016-04-16 DIAGNOSIS — N649 Disorder of breast, unspecified: Secondary | ICD-10-CM

## 2016-04-16 HISTORY — DX: Disorder of breast, unspecified: N64.9

## 2016-04-27 ENCOUNTER — Encounter (HOSPITAL_BASED_OUTPATIENT_CLINIC_OR_DEPARTMENT_OTHER): Payer: Self-pay | Admitting: *Deleted

## 2016-04-28 ENCOUNTER — Other Ambulatory Visit: Payer: Self-pay | Admitting: General Surgery

## 2016-04-28 DIAGNOSIS — D4862 Neoplasm of uncertain behavior of left breast: Secondary | ICD-10-CM

## 2016-04-30 ENCOUNTER — Ambulatory Visit
Admission: RE | Admit: 2016-04-30 | Discharge: 2016-04-30 | Disposition: A | Discharge status: Home / Self Care | Payer: 59 | Source: Ambulatory Visit | Attending: General Surgery | Admitting: General Surgery

## 2016-04-30 DIAGNOSIS — D4862 Neoplasm of uncertain behavior of left breast: Secondary | ICD-10-CM

## 2016-05-04 ENCOUNTER — Ambulatory Visit (HOSPITAL_BASED_OUTPATIENT_CLINIC_OR_DEPARTMENT_OTHER): Payer: 59 | Admitting: Anesthesiology

## 2016-05-04 ENCOUNTER — Ambulatory Visit
Admission: RE | Admit: 2016-05-04 | Discharge: 2016-05-04 | Disposition: A | Discharge status: Home / Self Care | Payer: 59 | Source: Ambulatory Visit | Attending: General Surgery | Admitting: General Surgery

## 2016-05-04 ENCOUNTER — Encounter (HOSPITAL_BASED_OUTPATIENT_CLINIC_OR_DEPARTMENT_OTHER)
Admission: RE | Disposition: A | Discharge status: Home / Self Care | Payer: Self-pay | Source: Ambulatory Visit | Attending: General Surgery

## 2016-05-04 ENCOUNTER — Ambulatory Visit (HOSPITAL_BASED_OUTPATIENT_CLINIC_OR_DEPARTMENT_OTHER)
Admission: RE | Admit: 2016-05-04 | Discharge: 2016-05-04 | Disposition: A | Discharge status: Home / Self Care | Payer: 59 | Source: Ambulatory Visit | Attending: General Surgery | Admitting: General Surgery

## 2016-05-04 ENCOUNTER — Encounter (HOSPITAL_BASED_OUTPATIENT_CLINIC_OR_DEPARTMENT_OTHER): Payer: Self-pay | Admitting: *Deleted

## 2016-05-04 DIAGNOSIS — D4862 Neoplasm of uncertain behavior of left breast: Secondary | ICD-10-CM

## 2016-05-04 DIAGNOSIS — N6489 Other specified disorders of breast: Secondary | ICD-10-CM | POA: Insufficient documentation

## 2016-05-04 DIAGNOSIS — D242 Benign neoplasm of left breast: Secondary | ICD-10-CM | POA: Insufficient documentation

## 2016-05-04 HISTORY — PX: BREAST LUMPECTOMY WITH RADIOACTIVE SEED LOCALIZATION: SHX6424

## 2016-05-04 HISTORY — DX: Adverse effect of unspecified anesthetic, initial encounter: T41.45XA

## 2016-05-04 HISTORY — DX: Dental restoration status: Z98.811

## 2016-05-04 HISTORY — DX: Other specified behavioral and emotional disorders with onset usually occurring in childhood and adolescence: F98.8

## 2016-05-04 HISTORY — DX: Major depressive disorder, single episode, unspecified: F32.9

## 2016-05-04 HISTORY — DX: Depression, unspecified: F32.A

## 2016-05-04 HISTORY — DX: Disorder of breast, unspecified: N64.9

## 2016-05-04 HISTORY — DX: Other complications of anesthesia, initial encounter: T88.59XA

## 2016-05-04 SURGERY — BREAST LUMPECTOMY WITH RADIOACTIVE SEED LOCALIZATION
Anesthesia: General | Site: Breast | Laterality: Left

## 2016-05-04 MED ORDER — ONDANSETRON HCL 4 MG/2ML IJ SOLN
INTRAMUSCULAR | Status: AC
  Filled 2016-05-04: qty 2

## 2016-05-04 MED ORDER — CEFAZOLIN SODIUM-DEXTROSE 2-4 GM/100ML-% IV SOLN
INTRAVENOUS | Status: AC
  Filled 2016-05-04: qty 100

## 2016-05-04 MED ORDER — FENTANYL CITRATE (PF) 100 MCG/2ML IJ SOLN
50.0000 ug | INTRAMUSCULAR | Status: DC | PRN
  Administered 2016-05-04: 100 ug via INTRAVENOUS

## 2016-05-04 MED ORDER — DEXAMETHASONE SODIUM PHOSPHATE 4 MG/ML IJ SOLN
INTRAMUSCULAR | Status: DC | PRN
  Administered 2016-05-04: 10 mg via INTRAVENOUS

## 2016-05-04 MED ORDER — DEXAMETHASONE SODIUM PHOSPHATE 10 MG/ML IJ SOLN
INTRAMUSCULAR | Status: AC
  Filled 2016-05-04: qty 1

## 2016-05-04 MED ORDER — PROPOFOL 10 MG/ML IV BOLUS
INTRAVENOUS | Status: DC | PRN
  Administered 2016-05-04: 200 mg via INTRAVENOUS

## 2016-05-04 MED ORDER — CEFAZOLIN SODIUM-DEXTROSE 2-4 GM/100ML-% IV SOLN
2.0000 g | INTRAVENOUS | Status: AC
  Administered 2016-05-04: 2 g via INTRAVENOUS

## 2016-05-04 MED ORDER — GLYCOPYRROLATE 0.2 MG/ML IJ SOLN
0.2000 mg | Freq: Once | INTRAMUSCULAR | Status: AC | PRN
  Administered 2016-05-04: 0.1 mg via INTRAVENOUS

## 2016-05-04 MED ORDER — MIDAZOLAM HCL 2 MG/2ML IJ SOLN
1.0000 mg | INTRAMUSCULAR | Status: DC | PRN
  Administered 2016-05-04: 2 mg via INTRAVENOUS

## 2016-05-04 MED ORDER — ATROPINE SULFATE 1 MG/ML IJ SOLN
INTRAMUSCULAR | Status: AC
  Filled 2016-05-04: qty 1

## 2016-05-04 MED ORDER — LIDOCAINE HCL (CARDIAC) 20 MG/ML IV SOLN
INTRAVENOUS | Status: DC | PRN
  Administered 2016-05-04: 30 mg via INTRAVENOUS

## 2016-05-04 MED ORDER — CHLORHEXIDINE GLUCONATE 4 % EX LIQD: 1.0000 "application " | Freq: Once | CUTANEOUS | Status: DC

## 2016-05-04 MED ORDER — EPHEDRINE SULFATE 50 MG/ML IJ SOLN
INTRAMUSCULAR | Status: DC | PRN
  Administered 2016-05-04 (×2): 10 mg via INTRAVENOUS

## 2016-05-04 MED ORDER — FENTANYL CITRATE (PF) 100 MCG/2ML IJ SOLN
INTRAMUSCULAR | Status: AC
  Filled 2016-05-04: qty 2

## 2016-05-04 MED ORDER — OXYCODONE-ACETAMINOPHEN 5-325 MG PO TABS: 1.0000 | ORAL_TABLET | ORAL | Status: DC | PRN

## 2016-05-04 MED ORDER — PHENYLEPHRINE 40 MCG/ML (10ML) SYRINGE FOR IV PUSH (FOR BLOOD PRESSURE SUPPORT)
PREFILLED_SYRINGE | INTRAVENOUS | Status: AC
  Filled 2016-05-04: qty 10

## 2016-05-04 MED ORDER — SUCCINYLCHOLINE CHLORIDE 200 MG/10ML IV SOSY
PREFILLED_SYRINGE | INTRAVENOUS | Status: AC
  Filled 2016-05-04: qty 10

## 2016-05-04 MED ORDER — ONDANSETRON HCL 4 MG/2ML IJ SOLN
INTRAMUSCULAR | Status: DC | PRN
Start: 2016-05-04 — End: 2016-05-04
  Administered 2016-05-04: 4 mg via INTRAVENOUS

## 2016-05-04 MED ORDER — BUPIVACAINE-EPINEPHRINE (PF) 0.25% -1:200000 IJ SOLN
INTRAMUSCULAR | Status: DC | PRN
  Administered 2016-05-04: 20 mL

## 2016-05-04 MED ORDER — SCOPOLAMINE 1 MG/3DAYS TD PT72: 1.0000 | MEDICATED_PATCH | Freq: Once | TRANSDERMAL | Status: DC | PRN

## 2016-05-04 MED ORDER — LIDOCAINE 2% (20 MG/ML) 5 ML SYRINGE
INTRAMUSCULAR | Status: AC
  Filled 2016-05-04: qty 5

## 2016-05-04 MED ORDER — EPHEDRINE 5 MG/ML INJ
INTRAVENOUS | Status: AC
  Filled 2016-05-04: qty 10

## 2016-05-04 MED ORDER — LACTATED RINGERS IV SOLN
INTRAVENOUS | Status: DC
  Administered 2016-05-04: 09:00:00 via INTRAVENOUS

## 2016-05-04 MED ORDER — MIDAZOLAM HCL 2 MG/2ML IJ SOLN
INTRAMUSCULAR | Status: AC
  Filled 2016-05-04: qty 2

## 2016-05-04 SURGICAL SUPPLY — 46 items
APPLIER CLIP 9.375 MED OPEN (MISCELLANEOUS)
APR CLP MED 9.3 20 MLT OPN (MISCELLANEOUS)
BLADE SURG 15 STRL LF DISP TIS (BLADE) ×1 IMPLANT
BLADE SURG 15 STRL SS (BLADE) ×3
CANISTER SUC SOCK COL 7IN (MISCELLANEOUS) ×1 IMPLANT
CANISTER SUCT 1200ML W/VALVE (MISCELLANEOUS) ×1 IMPLANT
CHLORAPREP W/TINT 26ML (MISCELLANEOUS) ×3 IMPLANT
CLIP APPLIE 9.375 MED OPEN (MISCELLANEOUS) IMPLANT
COVER BACK TABLE 60X90IN (DRAPES) ×3 IMPLANT
COVER MAYO STAND STRL (DRAPES) ×3 IMPLANT
COVER PROBE W GEL 5X96 (DRAPES) ×3 IMPLANT
DECANTER SPIKE VIAL GLASS SM (MISCELLANEOUS) IMPLANT
DEVICE DUBIN W/COMP PLATE 8390 (MISCELLANEOUS) ×3 IMPLANT
DRAPE LAPAROSCOPIC ABDOMINAL (DRAPES) ×2 IMPLANT
DRAPE UTILITY XL STRL (DRAPES) ×3 IMPLANT
ELECT COATED BLADE 2.86 ST (ELECTRODE) ×3 IMPLANT
ELECT REM PT RETURN 9FT ADLT (ELECTROSURGICAL) ×3
ELECTRODE REM PT RTRN 9FT ADLT (ELECTROSURGICAL) ×1 IMPLANT
GLOVE BIO SURGEON STRL SZ7.5 (GLOVE) ×6 IMPLANT
GLOVE BIOGEL PI IND STRL 7.0 (GLOVE) IMPLANT
GLOVE BIOGEL PI IND STRL 8 (GLOVE) IMPLANT
GLOVE BIOGEL PI INDICATOR 7.0 (GLOVE) ×2
GLOVE BIOGEL PI INDICATOR 8 (GLOVE) ×2
GLOVE SURG SS PI 8.0 STRL IVOR (GLOVE) ×2 IMPLANT
GOWN STRL REUS W/ TWL LRG LVL3 (GOWN DISPOSABLE) ×2 IMPLANT
GOWN STRL REUS W/TWL LRG LVL3 (GOWN DISPOSABLE) ×6
ILLUMINATOR WAVEGUIDE N/F (MISCELLANEOUS) ×2 IMPLANT
KIT MARKER MARGIN INK (KITS) ×3 IMPLANT
LIGHT WAVEGUIDE WIDE FLAT (MISCELLANEOUS) IMPLANT
LIQUID BAND (GAUZE/BANDAGES/DRESSINGS) ×3 IMPLANT
NDL HYPO 25X1 1.5 SAFETY (NEEDLE) IMPLANT
NEEDLE HYPO 25X1 1.5 SAFETY (NEEDLE) ×3 IMPLANT
NS IRRIG 1000ML POUR BTL (IV SOLUTION) ×2 IMPLANT
PACK BASIN DAY SURGERY FS (CUSTOM PROCEDURE TRAY) ×3 IMPLANT
PENCIL BUTTON HOLSTER BLD 10FT (ELECTRODE) ×3 IMPLANT
SLEEVE SCD COMPRESS KNEE MED (MISCELLANEOUS) ×3 IMPLANT
SPONGE LAP 18X18 X RAY DECT (DISPOSABLE) ×3 IMPLANT
SUT MON AB 4-0 PC3 18 (SUTURE) ×2 IMPLANT
SUT SILK 2 0 SH (SUTURE) IMPLANT
SUT VICRYL 3-0 CR8 SH (SUTURE) ×3 IMPLANT
SYR CONTROL 10ML LL (SYRINGE) ×2 IMPLANT
TOWEL OR 17X24 6PK STRL BLUE (TOWEL DISPOSABLE) ×3 IMPLANT
TOWEL OR NON WOVEN STRL DISP B (DISPOSABLE) ×1 IMPLANT
TUBE CONNECTING 20'X1/4 (TUBING)
TUBE CONNECTING 20X1/4 (TUBING) ×1 IMPLANT
YANKAUER SUCT BULB TIP NO VENT (SUCTIONS) IMPLANT

## 2016-05-04 NOTE — Anesthesia Procedure Notes (Signed)
Procedure Name: LMA Insertion Date/Time: 05/04/2016 8:48 AM Performed by: Melynda Ripple D Pre-anesthesia Checklist: Patient identified, Emergency Drugs available, Suction available and Patient being monitored Patient Re-evaluated:Patient Re-evaluated prior to inductionOxygen Delivery Method: Circle system utilized Preoxygenation: Pre-oxygenation with 100% oxygen Intubation Type: IV induction Ventilation: Mask ventilation without difficulty LMA: LMA inserted LMA Size: 4.0 Number of attempts: 1 Airway Equipment and Method: Bite block Placement Confirmation: positive ETCO2 Tube secured with: Tape Dental Injury: Teeth and Oropharynx as per pre-operative assessment

## 2016-05-04 NOTE — H&P (Signed)
Denise [name]  Location: [facility] Surgery Patient #: [id number] DOB: 1976/09/04 Married / Language: English / Race: White Female   History of Present Illness  Patient words: L br mass.  The patient is a 40 year old female who presents with a breast mass. We are asked to see the patient in consultation by Dr. Everlean Alstrom to evaluate her for a mass in the left breast. The patient is a 40 year old white female who has been getting regular mammograms and MRI studies because of dense breast tissue. She also has a significant history of breast cancer in her mother who is also a patient of mine. On her most recent studies she was found to have a 6 mm mass in the lower outer quadrant of the left breast. 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 The pathologist could not distinguish between a fibroadenoma and a phylloides tumor. Given her family history she is also considering genetic evaluation and bilateral mastectomies with reconstruction. She is thinking about going to [facility] just as her mother did.   Other Problems  Depression Hypercholesterolemia Lump In Breast Migraine Headache  Past Surgical History  Appendectomy Breast Biopsy Bilateral. multiple Cesarean Section - 1  Diagnostic Studies History Colonoscopy 1-5 years ago Mammogram within last year Pap Smear 1-5 years ago  Allergies  No Known Drug Allergies  Medication History  Cymbalta (30MG  Capsule DR Part, Oral) Active. Ritalin LA (60MG  Capsule ER 24HR, Oral) Active. LaMICtal (100MG  Tablet, Oral) Active. Medications Reconciled  Social History  Alcohol use Occasional alcohol use. Caffeine use Coffee. No drug use Tobacco use Never smoker.  Family History  Arthritis Mother. Breast Cancer Family Members In General, Mother. Cerebrovascular Accident Mother. Diabetes Mellitus Mother. Heart Disease Mother. Melanoma Father.  Pregnancy / Birth  History Age at menarche 67 years. Contraceptive History Intrauterine device, Oral contraceptives. Gravida 1 Maternal age 59-30 Para 1 Regular periods    Review of Systems  General Not Present- Appetite Loss, Chills, Fatigue, Fever, Night Sweats, Weight Gain and Weight Loss. Skin Not Present- Change in Wart/Mole, Dryness, Hives, Jaundice, New Lesions, Non-Healing Wounds, Rash and Ulcer. HEENT Not Present- Earache, Hearing Loss, Hoarseness, Nose Bleed, Oral Ulcers, Ringing in the Ears, Seasonal Allergies, Sinus Pain, Sore Throat, Visual Disturbances, Wears glasses/contact lenses and Yellow Eyes. Respiratory Not Present- Bloody sputum, Chronic Cough, Difficulty Breathing, Snoring and Wheezing. Breast Present- Breast Mass. Not Present- Breast Pain, Nipple Discharge and Skin Changes. Cardiovascular Not Present- Chest Pain, Difficulty Breathing Lying Down, Leg Cramps, Palpitations, Rapid Heart Rate, Shortness of Breath and Swelling of Extremities. Gastrointestinal Not Present- Abdominal Pain, Bloating, Bloody Stool, Change in Bowel Habits, Chronic diarrhea, Constipation, Difficulty Swallowing, Excessive gas, Gets full quickly at meals, Hemorrhoids, Indigestion, Nausea, Rectal Pain and Vomiting. Female Genitourinary Not Present- Frequency, Nocturia, Painful Urination, Pelvic Pain and Urgency. Musculoskeletal Not Present- Back Pain, Joint Pain, Joint Stiffness, Muscle Pain, Muscle Weakness and Swelling of Extremities. Neurological Present- Headaches. Not Present- Decreased Memory, Fainting, Numbness, Seizures, Tingling, Tremor, Trouble walking and Weakness. Psychiatric Not Present- Anxiety, Bipolar, Change in Sleep Pattern, Depression, Fearful and Frequent crying. Endocrine Not Present- Cold Intolerance, Excessive Hunger, Hair Changes, Heat Intolerance, Hot flashes and New Diabetes. Hematology Not Present- Easy Bruising, Excessive bleeding, Gland problems, HIV and Persistent  Infections.  Vitals  Weight: 186.38 lb Height: 68in Body Surface Area: 1.98 m Body Mass Index: 28.34 kg/m  BP: 122/84 (Sitting, Left Arm, Standard)       Physical Exam General Mental Status-Alert.  General Appearance-Consistent with stated age. Hydration-Well hydrated. Voice-Normal.  Head and Neck Head-normocephalic, atraumatic with no lesions or palpable masses. Trachea-midline. Thyroid Gland Characteristics - normal size and consistency.  Eye Eyeball - Bilateral-Extraocular movements intact. Sclera/Conjunctiva - Bilateral-No scleral icterus.  Chest and Lung Exam Chest and lung exam reveals -quiet, even and easy respiratory effort with no use of accessory muscles and on auscultation, normal breath sounds, no adventitious sounds and normal vocal resonance. Inspection Chest Wall - Normal. Back - normal.  Breast Note: There is no palpable mass in either breast. There is no palpable axillary, supraclavicular, or cervical lymphadenopathy.   Cardiovascular Cardiovascular examination reveals -normal heart sounds, regular rate and rhythm with no murmurs and normal pedal pulses bilaterally.  Abdomen Inspection Inspection of the abdomen reveals - No Hernias. Skin - Scar - no surgical scars. Palpation/Percussion Palpation and Percussion of the abdomen reveal - Soft, Non Tender, No Rebound tenderness, No Rigidity (guarding) and No hepatosplenomegaly. Auscultation Auscultation of the abdomen reveals - Bowel sounds normal.  Neurologic Neurologic evaluation reveals -alert and oriented x 3 with no impairment of recent or remote memory. Mental Status-Normal.  Musculoskeletal Normal Exam - Left-Upper Extremity Strength Normal and Lower Extremity Strength Normal. Normal Exam - Right-Upper Extremity Strength Normal and Lower Extremity Strength Normal.  Lymphatic Head & Neck  General Head & Neck Lymphatics: Bilateral - Description -  Normal. Axillary  General Axillary Region: Bilateral - Description - Normal. Tenderness - Non Tender. Femoral & Inguinal  Generalized Femoral & Inguinal Lymphatics: Bilateral - Description - Normal. Tenderness - Non Tender.    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 The pathologist cannot distinguish between a fibroadenoma and a Phylloides tumor. Because of this I would recommend that the area be removed. I have discussed with her in detail the risks and benefits of the operation to do this as well as some of the technical aspects and she understands and wishes to proceed. I will plan for a left breast radioactive seed localized lumpectomy

## 2016-05-04 NOTE — Anesthesia Postprocedure Evaluation (Signed)
Anesthesia Post Note  Patient: Denise [name]  Procedure(s) Performed: Procedure(s) (LRB): LEFT BREAST LUMPECTOMY WITH RADIOACTIVE SEED LOCALIZATION (Left)  Patient location during evaluation: PACU Anesthesia Type: General Level of consciousness: awake and alert, oriented and patient cooperative Pain management: pain level controlled Vital Signs Assessment: post-procedure vital signs reviewed and stable Respiratory status: spontaneous breathing, nonlabored ventilation and respiratory function stable Cardiovascular status: blood pressure returned to baseline and stable Postop Assessment: no signs of nausea or vomiting Anesthetic complications: no    Last Vitals:  Filed Vitals:   05/04/16 1015 05/04/16 1039  BP: 122/74 122/74  Pulse: 88 79  Temp:  36.5 C  Resp: 19 18    Last Pain:  Filed Vitals:   05/04/16 1040  PainSc: 0-No pain                 Sergey Ishler,E. Janaisa Birkland

## 2016-05-04 NOTE — Interval H&P Note (Signed)
History and Physical Interval Note:  05/04/2016 7:08 AM  Denise [name]  has presented today for surgery, with the diagnosis of left breast biphasic lesion  The various methods of treatment have been discussed with the patient and family. After consideration of risks, benefits and other options for treatment, the patient has consented to  Procedure(s): LEFT BREAST LUMPECTOMY WITH RADIOACTIVE SEED LOCALIZATION (Left) as a surgical intervention .  The patient's history has been reviewed, patient examined, no change in status, stable for surgery.  I have reviewed the patient's chart and labs.  Questions were answered to the patient's satisfaction.     TOTH III,Setareh Rom S

## 2016-05-04 NOTE — Discharge Instructions (Signed)

## 2016-05-04 NOTE — Transfer of Care (Signed)
Immediate Anesthesia Transfer of Care Note  Patient: Denise [name]  Procedure(s) Performed: Procedure(s): LEFT BREAST LUMPECTOMY WITH RADIOACTIVE SEED LOCALIZATION (Left)  Patient Location: PACU  Anesthesia Type:General  Level of Consciousness: awake, alert  and oriented  Airway & Oxygen Therapy: Patient Spontanous Breathing and Patient connected to face mask oxygen  Post-op Assessment: Report given to RN and Post -op Vital signs reviewed and stable  Post vital signs: Reviewed and stable  Last Vitals:  Filed Vitals:   05/04/16 0758  BP: 105/60  Pulse: 55  Temp: 36.5 C  Resp: 20    Last Pain: There were no vitals filed for this visit.       Complications: No apparent anesthesia complications

## 2016-05-04 NOTE — Anesthesia Preprocedure Evaluation (Addendum)
Anesthesia Evaluation  Patient identified by MRN, date of birth, ID band Patient awake    Reviewed: Allergy & Precautions, NPO status , Patient's Chart, lab work & pertinent test results  History of Anesthesia Complications Negative for: history of anesthetic complications  Airway Mallampati: II  TM Distance: >3 FB Neck ROM: Full    Dental  (+) Dental Advisory Given   Pulmonary neg pulmonary ROS,    breath sounds clear to auscultation       Cardiovascular negative cardio ROS   Rhythm:Regular Rate:Normal     Neuro/Psych  Headaches, PSYCHIATRIC DISORDERS (ADD) Depression    GI/Hepatic negative GI ROS, Neg liver ROS,   Endo/Other  negative endocrine ROS  Renal/GU negative Renal ROS     Musculoskeletal   Abdominal   Peds  Hematology negative hematology ROS (+)   Anesthesia Other Findings   Reproductive/Obstetrics LMP 04/22/16                           Anesthesia Physical Anesthesia Plan  ASA: II  Anesthesia Plan: General   Post-op Pain Management:    Induction: Intravenous  Airway Management Planned: LMA  Additional Equipment:   Intra-op Plan:   Post-operative Plan:   Informed Consent: I have reviewed the patients History and Physical, chart, labs and discussed the procedure including the risks, benefits and alternatives for the proposed anesthesia with the patient or authorized representative who has indicated his/her understanding and acceptance.   Dental advisory given  Plan Discussed with: CRNA and Surgeon  Anesthesia Plan Comments: (Plan routine monitors, GA- LMA OK)        Anesthesia Quick Evaluation

## 2016-05-04 NOTE — Op Note (Signed)
05/04/2016  9:45 AM  PATIENT:  Denise [name]  40 y.o. female  PRE-OPERATIVE DIAGNOSIS:  left breast biphasic lesion  POST-OPERATIVE DIAGNOSIS:  left breast biphasic lesion  PROCEDURE:  Procedure(s): LEFT BREAST LUMPECTOMY WITH RADIOACTIVE SEED LOCALIZATION (Left)  SURGEON:  Surgeon(s) and Role:    * Jovita Kussmaul, MD - Primary  PHYSICIAN ASSISTANT:   ASSISTANTS: none   ANESTHESIA:   general  EBL:     BLOOD ADMINISTERED:none  DRAINS: none   LOCAL MEDICATIONS USED:  MARCAINE     SPECIMEN:  Source of Specimen:  left breast tissue  DISPOSITION OF SPECIMEN:  PATHOLOGY  COUNTS:  YES  TOURNIQUET:  * No tourniquets in log *  DICTATION: .Dragon Dictation   After informed consent was obtained the patient was brought to the operating room and placed in the supine position on the operating room table. After adequate induction of general anesthesia the patient's left breast was prepped with ChloraPrep, allowed to dry, and draped in usual sterile manner. An appropriate timeout was performed. Previously an I-125 seed was placed in the lower outer quadrant of the left breast to mark a biphasic lesion. The neoprobe was said to I-125 in the area of radioactivity was readily identified in the lower outer quadrant of the left breast. The area around the radioactivity was infiltrated with quarter percent Marcaine. A curvilinear incision was made along the lower outer edge of the left areola with a 15 blade knife. The incision was carried through the skin and subcutaneous tissue sharply with electrocautery. The neoprobe was used to direct the dissection towards the radioactive seed. While checking the area of radioactivity frequently a circular portion of breast tissue was excised around the radioactive seed. Once the specimen was removed it was oriented with the appropriate paint colors. A specimen radiograph was obtained that showed  The seed to be within the specimen. I did not see the clip. On  the mammograms the clip was very close to the seed and appeared to be just lateral and superior to the seed. An additional lateral and superior margin was taken sharply with the electrocautery. Each of the specimens was marked with the appropriate paint color. Both of these specimens were x-rayed and I still did not find the clip. The lumpectomy cavity was examined and was very clean with no palpable masses. At this point the wound was infiltrated with quarter percent Marcaine and irrigated with saline. Hemostasis was achieved using the Bovie electrocautery. The deep layer of the wound was closed with interrupted  3-0 Vicryl stitches. The skin was then closed with interrupted 4-0 Monocryl subcuticular stitches. Dermabond dressings were applied. The patient tolerated the procedure well. At the end of the case all needle sponge and instrument counts were correct. The patient was then awakened and taken to recovery in stable condition.  PLAN OF CARE: Discharge to home after PACU  PATIENT DISPOSITION:  PACU - hemodynamically stable.   Delay start of Pharmacological VTE agent (>24hrs) due to surgical blood loss or risk of bleeding: not applicable

## 2016-05-05 ENCOUNTER — Encounter (HOSPITAL_BASED_OUTPATIENT_CLINIC_OR_DEPARTMENT_OTHER): Payer: Self-pay | Admitting: General Surgery

## 2016-06-30 IMAGING — MR MR BREAST BX W LOC DEV 1ST LESION IMAGE BX SPEC MR GUIDE*L*
6 of 9 series · 33 of 48 positions shown · IV contrast (multihance)
Comparison: Previous exams.

ADDENDUM:
Pathology revealed a biphasic (stromal/epithelial) lesion in the
left breast, with the differential diagnoses being a fibroadenoma, a
phyllodes tumor, and a hamartomatous-type lesion. Excision is
recommended. This was found to be concordant by Dr. Nora Shah Rossella.
Pathology results were discussed with the patient by telephone. The
patient reported doing well after the biopsy with tenderness at the
site. Post biopsy instructions and care were reviewed and questions
were answered. The patient was encouraged to call The [REDACTED]
consultation has been arranged with Dr. Kevin Levin Leon at [REDACTED] on January 13, 2016.

Pathology results reported by Arbesa-Ardis Myjevic RN, BSN on 01/06/2016.
CLINICAL DATA: 39-year-old female with enhancing mass seen in the
lower outer left breast on high risk screening breast MRI.
EXAM:
MRI GUIDED CORE NEEDLE BIOPSY OF THE LEFT BREAST
TECHNIQUE: Multiplanar, multisequence MR imaging of the left breast was
performed both before and after administration of intravenous
contrast.
CONTRAST:  20 mL IV MultiHance.

[Series 2: axial pre-cm · axial · non-contrast · 1.3mm · 0.73mm/px · z∈[-103,+83]mm · 6 of 144 slices shown]
[im 1/144]
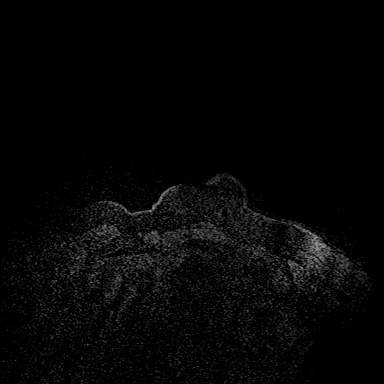
[im 29/144]
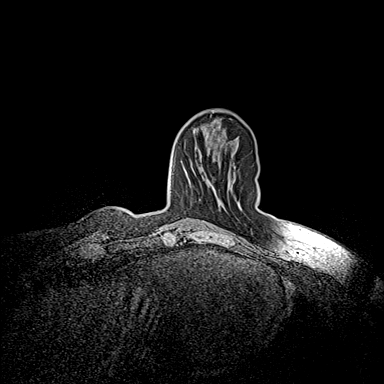
[im 58/144]
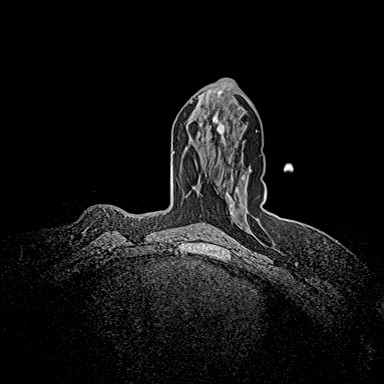
[im 86/144]
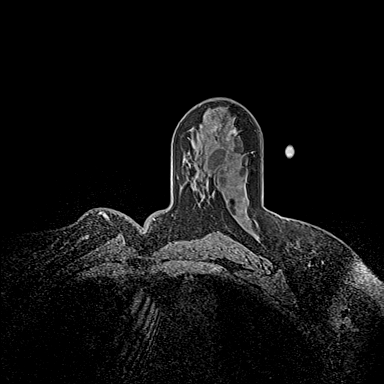
[im 115/144]
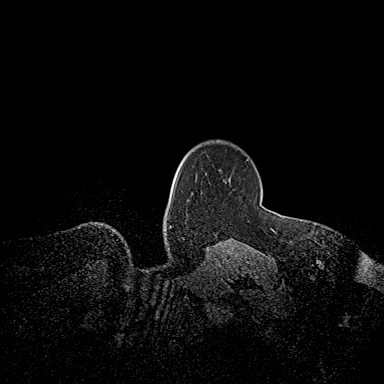
[im 144/144]
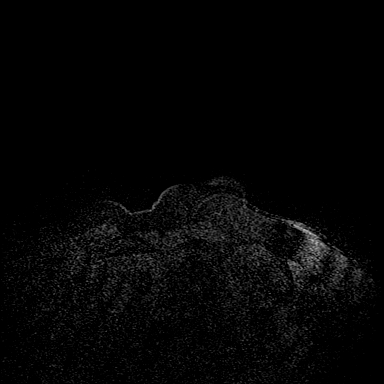

[Series 3: axial pre-cm_s2_(id) · axial · non-contrast · 1.3mm · 0.73mm/px · z∈[-103,+83]mm · 6 of 144 slices shown]
[im 1/144]
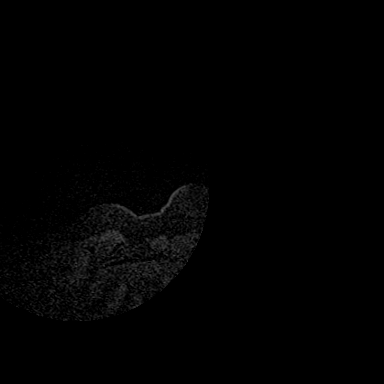
[im 29/144]
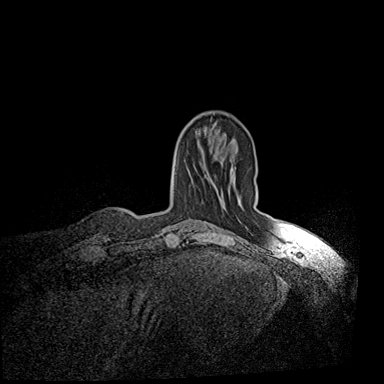
[im 58/144]
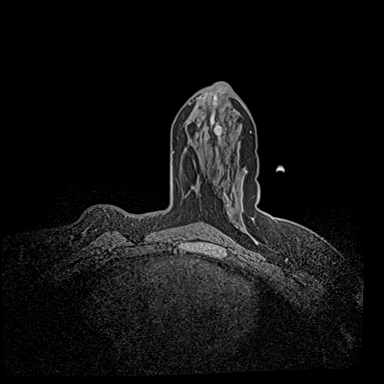
[im 86/144]
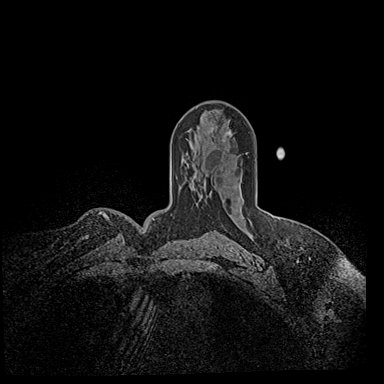
[im 115/144]
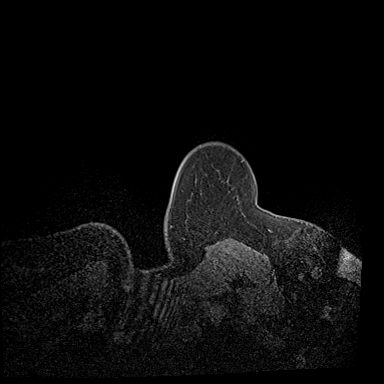
[im 144/144]
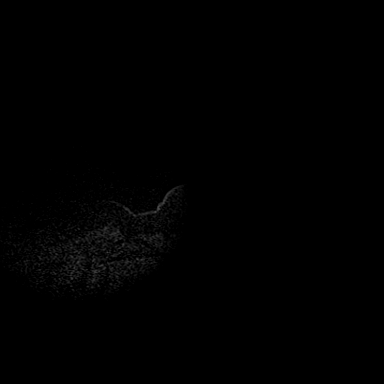

[Series 4: axial post 20 · axial · 1.3mm · 0.73mm/px · z∈[-103,+83]mm · 6 of 144 slices shown (1 of 3)]
[im 1/144]
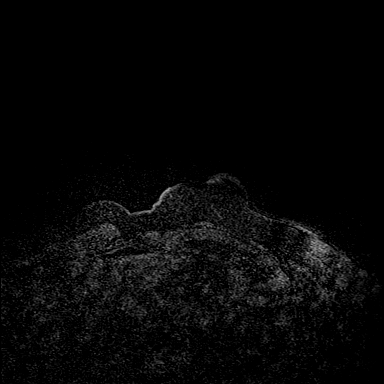
[im 29/144]
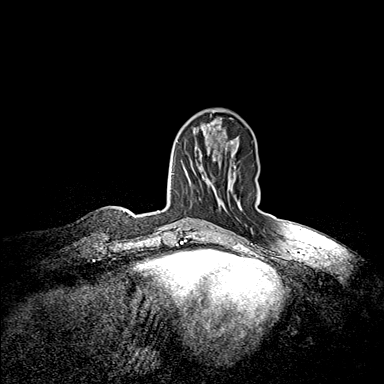
[im 58/144]
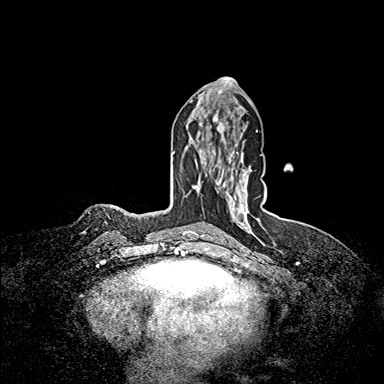
[im 86/144]
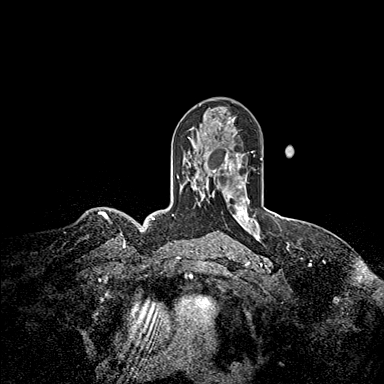
[im 115/144]
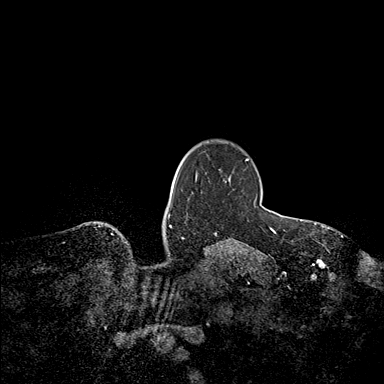
[im 144/144]
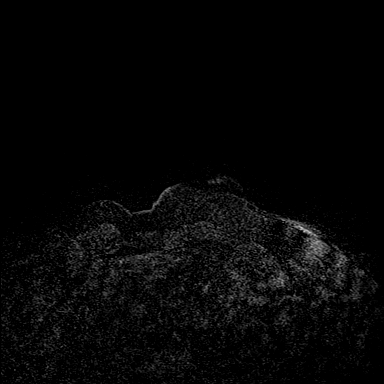

[Series 5: axial post 20 · axial · 1.3mm · 0.73mm/px · z∈[-103,+83]mm · 5 of 144 slices shown (2 of 3)]
[im 1/144]
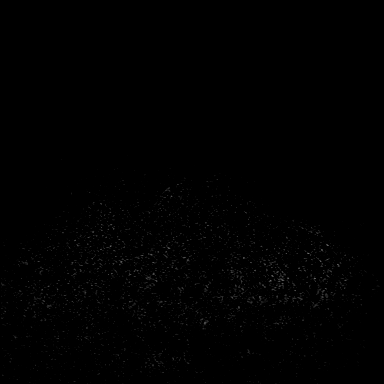
[im 36/144]
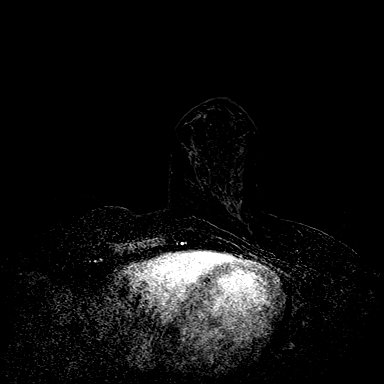
[im 72/144]
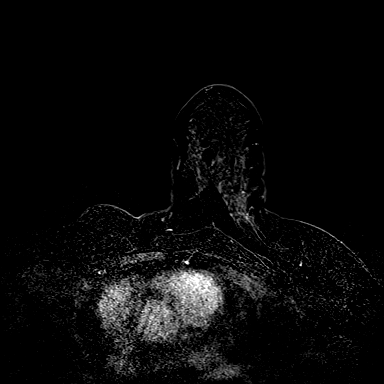
[im 108/144]
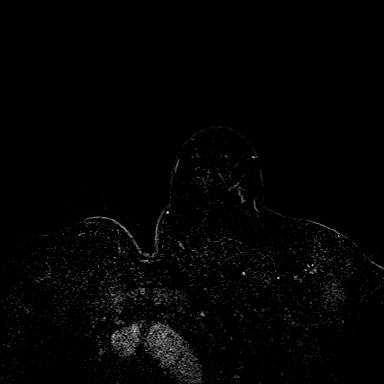
[im 144/144]
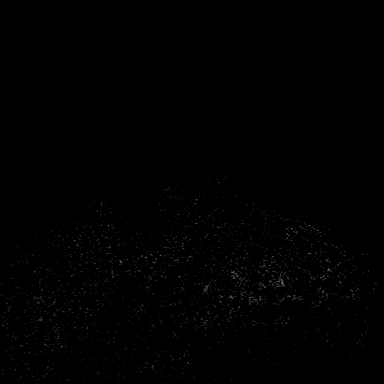

[Series 6: axial post 20 · axial · 1.3mm · 0.73mm/px · z∈[-103,+83]mm · 5 of 144 slices shown (3 of 3)]
[im 1/144]
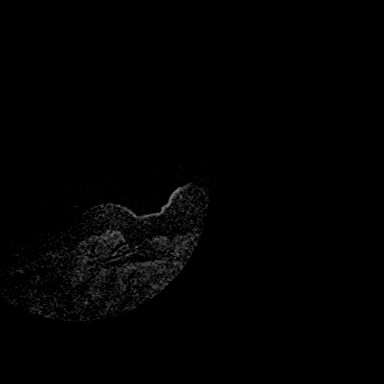
[im 36/144]
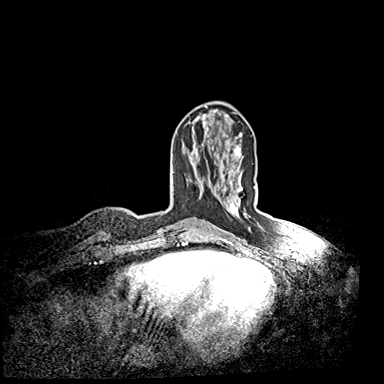
[im 72/144]
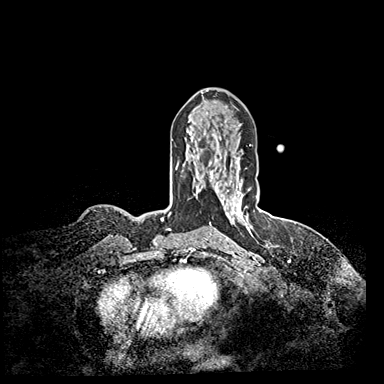
[im 108/144]
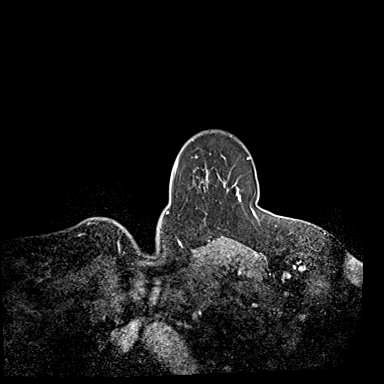
[im 144/144]
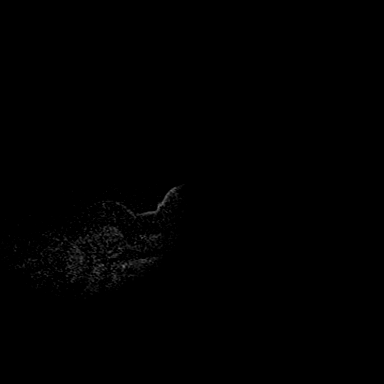

[Series 7: axial confirmation · axial · 1.3mm · 0.73mm/px · z∈[-103,+83]mm · 5 of 144 slices shown]
[im 1/144]
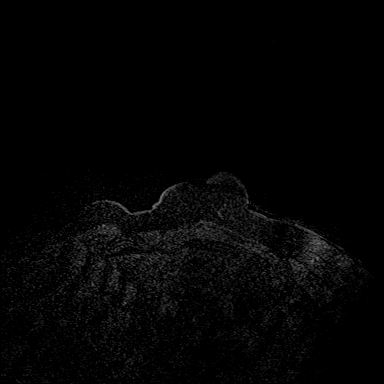
[im 36/144]
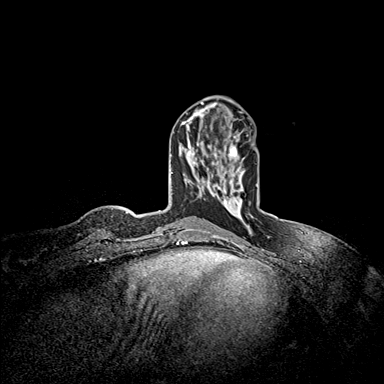
[im 72/144]
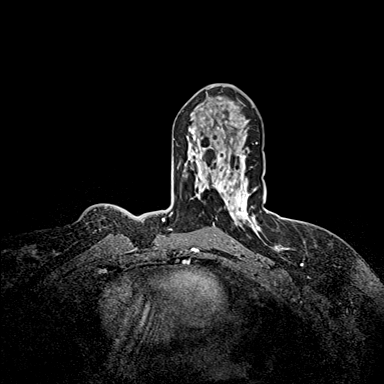
[im 108/144]
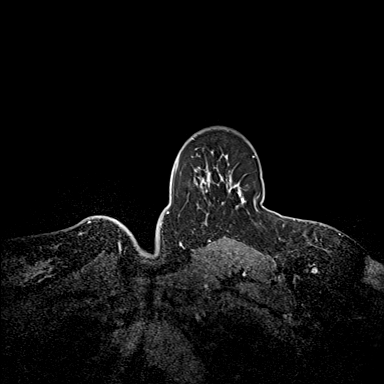
[im 144/144]
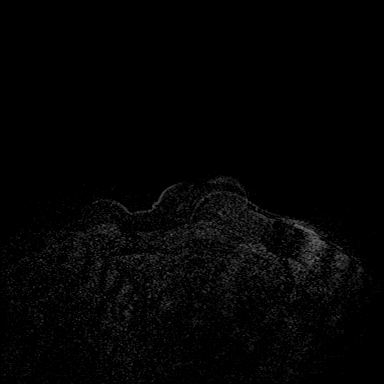

[33 of 48 positions shown; findings below may reference images not displayed]

FINDINGS: I met with the patient, and we discussed the procedure of MRI guided
biopsy, including risks, benefits, and alternatives. Specifically,
we discussed the risks of infection, bleeding, tissue injury, clip
migration, and inadequate sampling. Informed, written consent was
given. The usual time out protocol was performed immediately prior
to the procedure.

Using sterile technique, 1% Lidocaine, MRI guidance, and a 9 gauge
vacuum assisted device, biopsy was performed of the enhancing mass
in the lower outer left breast anterior to mid depth using a lateral
to medial approach. At the conclusion of the procedure, a dumbbell
tissue marker clip was deployed into the biopsy cavity. Follow-up
2-view mammogram was performed and dictated separately.
IMPRESSION: MRI guided biopsy of the enhancing mass in the lower outer left
breast. No apparent complications.

## 2016-07-14 ENCOUNTER — Encounter (HOSPITAL_BASED_OUTPATIENT_CLINIC_OR_DEPARTMENT_OTHER): Payer: Self-pay

## 2017-03-17 ENCOUNTER — Encounter: Payer: Self-pay | Admitting: Neurology

## 2017-03-17 ENCOUNTER — Ambulatory Visit (INDEPENDENT_AMBULATORY_CARE_PROVIDER_SITE_OTHER): Payer: 59 | Admitting: Neurology

## 2017-03-17 VITALS — BP 115/70 | HR 79 | Ht 68.0 in | Wt 221.0 lb

## 2017-03-17 DIAGNOSIS — G43009 Migraine without aura, not intractable, without status migrainosus: Secondary | ICD-10-CM

## 2017-03-17 NOTE — Progress Notes (Signed)
[facility]    Provider:  Dr Jaynee Eagles Referring Provider: Shon Baton, MD Primary Care Physician:  Precious Reel, MD  CC: Migraines and vertigo  Interval history 03/17/2017: She is doing well. Migraines and vertigo well controlled. She last had one a few months ago. [place] work when she does have a headache. No other issues.  Interval history 03/17/2016: KARINNE SCHMADER is a 41 y.o. female here as a referral from Dr. Virgina Jock for vertigo and migraines. PMHx IBS, ADD, anxiety, HLD, obesity, HTN. The onzentra worked. She has had 3 events since last being seen. They started with vertigo, dizziness and nausea. She took the [place] and it quickly worked, initially it burned throat but after that no side effects. She has not noticed any triggers. [place] diet and she lost 32 pounds.   MRI of the brain was unremarkable 12/24/2015:  No abnormal lesions are seen on diffusion-weighted views to suggest acute ischemia. The cortical sulci, fissures and cisterns are normal in size and appearance. Lateral, third and fourth ventricle are normal in size and appearance. No extra-axial fluid collections are seen. No evidence of mass effect or midline shift. No abnormal lesions are seen on post contrast views.   On sagittal views the posterior fossa, pituitary gland and corpus callosum are unremarkable. Minimal downward cerebellar tonsillar ectopia (3-70mm). No evidence of intracranial hemorrhage on SWI views. The orbits and their contents, paranasal sinuses and calvarium are unremarkable. Intracranial flow voids are present.   IMPRESSION:  Unremarkable MRI brain (with and without). No acute findings.   HPI: CIRE DEYARMIN is a 41 y.o. female here as a referral from Dr. Virgina Jock for vertigo and migraines. PMHx IBS, ADD, anxiety, HLD, obesity, HTN. Migraines started years ago. She has had 1-2 in the last 3-4 months. They are not that often. But she has significant vision changes, she loses her  peripheral vision. She started having vertigo lately. She has a FHx of brain aneurysm. Mother was hospitalized for 6 weeks after a ruptrue. Grandfather also had a stroke, dad's father and dad's brother had a stroke at a young age. She had an MRI of the brain 10 years ago and they looked for aneurysm and she had a cerebral angiorgram which was negative. Vertigo started in November and is worsening. Was coming and going, wasn't related to menstrual cycle like her migraines are. Would last a few hours, room spinning and nausea. Could be anytime of the day. Unknown if associated with head movements but she doesn't think so. At [place] she all of sudden felt like she was going to topple over, everything went spinning, nausea without associated headache. No preceding viruses or illnesses or ear infections prior to onset. She lays down and goes to sleep which helps, quiet room, has to sit still, if she moves it spins. No decreased hearing in the ears or fullness in the ears. She is having it every other week. Eyes will feel like they are moving with vision changes, she feels tired afterwards and nauseated. No known triggers. Blood pressure today is a little high but usually normal. No Hx of HTN, diabetes, smoking, alcohol. She has some elevated cholesterol and she just started a diet. No new medications. She feels nystagmus in the eyes.   Reviewed notes, labs and imaging from outside physicians, which showed:  IR Angiogram 2003: 1. NO ANGIOGRAPHIC EVIDENCE OF INTRACRANIAL OR EXTRACRANIAL ANEURYSMS. 2. NO EVIDENCE OF DISSECTIONS, VASOSPASM, OCCLUSIONS, OR STENOSES NOTED EITHER. 3. NO EVIDENCE DURAL ARTERIOVENOUS MALFORMATIONS  OR ARTERIOVENOUS MALFORMATIONS.  Reviewed records from obgyn. 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 Has Hx of migraines with aura. Also with IBS. Husband had vasectomy. She was referred to neurology for vertigo.   Last cmp I have available in  2011 is normal except for elevated glucose 148.  Review of Systems: Patient complains of symptoms per HPI as well as the following symptoms:headache, dizziness, depression . Pertinent negatives per HPI. All others negative.   Social History   Social History  . Marital status: Married    Spouse name: Eddie Dibbles  . Number of children: 1  . Years of education: 78   Occupational History  . [employer]    Social History Main Topics  . Smoking status: Never Smoker  . Smokeless tobacco: Never Used  . Alcohol use No  . Drug use: No  . Sexual activity: Not on file   Other Topics Concern  . Not on file   Social History Narrative   Lives with husband and daughter   Caffeine use: 0-1 per day    Family History  Problem Relation Age of Onset  . Cerebral aneurysm Mother   . Osteoarthritis Mother   . Nephrolithiasis Father   . Cerebral aneurysm Maternal Grandfather   . Heart disease Maternal Grandfather   . Aneurysm Paternal Uncle     Past Medical History:  Diagnosis Date  . ADD (attention deficit disorder)   . Breast lesion 04/2016   left biphasic lesion  . Complication of anesthesia    states anesthesia wears off quickly  . Dental crown present   . Depression   . IBS (irritable bowel syndrome)   . Migraines     Past Surgical History:  Procedure Laterality Date  . APPENDECTOMY  2010  . BREAST LUMPECTOMY WITH RADIOACTIVE SEED LOCALIZATION Left 05/04/2016   Procedure: LEFT BREAST LUMPECTOMY WITH RADIOACTIVE SEED LOCALIZATION;  Surgeon: Autumn Messing III, MD;  Location: [facility];  Service: General;  Laterality: Left;  . CESAREAN SECTION  02/25/2007  . LAPAROSCOPIC APPENDECTOMY  01/24/2010    Current Outpatient Prescriptions  Medication Sig Dispense Refill  . dexmethylphenidate (FOCALIN) 10 MG tablet     . DULoxetine (CYMBALTA) 30 MG capsule Take 30 mg by mouth daily.    Marland Kitchen lamoTRIgine (LAMICTAL) 200 MG tablet     . ondansetron (ZOFRAN) 4 MG tablet Take 4 mg  by mouth every 8 (eight) hours as needed for nausea or vomiting.    . SUMAtriptan Succinate (ONZETRA XSAIL) 11 MG/NOSEPC EXHP Place 2 sprays into the nose once. 8 each 11  . VYVANSE 70 MG capsule      No current facility-administered medications for this visit.     Allergies as of 03/17/2017  . (No Known Allergies)    Vitals: BP 115/70   Pulse 79   Ht 5\' 8"  (1.727 m)   Wt 221 lb (100.2 kg)   BMI 33.60 kg/m  Last Weight:  Wt Readings from Last 1 Encounters:  03/17/17 221 lb (100.2 kg)   Last Height:   Ht Readings from Last 1 Encounters:  03/17/17 5\' 8"  (1.727 m)   Physical exam: Exam: Gen: NAD, conversant, well nourised, obese, well groomed                     CV: RRR, no MRG. No Carotid Bruits. No peripheral edema, warm, nontender Eyes: Conjunctivae clear without exudates or hemorrhage  Neuro: Detailed  Neurologic Exam  Speech:    Speech is normal; fluent and spontaneous with normal comprehension.  Cognition:    The patient is oriented to person, place, and time;     recent and remote memory intact;     language fluent;     normal attention, concentration,     fund of knowledge Cranial Nerves:    The pupils are equal, round, and reactive to light. The fundi are normal and spontaneous venous pulsations are present. Visual fields are full to finger confrontation. Extraocular movements are intact. Trigeminal sensation is intact and the muscles of mastication are normal. The face is symmetric. The palate elevates in the midline. Hearing intact. Voice is normal. Shoulder shrug is normal. The tongue has normal motion without fasciculations.   Coordination:    Normal finger to nose and heel to shin. Normal rapid alternating movements.   Gait:    Heel-toe and tandem gait are normal.   Motor Observation:    No asymmetry, no atrophy, and no involuntary movements noted. Tone:    Normal muscle tone.    Posture:    Posture is normal. normal erect    Strength:     Strength is V/V in the upper and lower limbs.      Sensation: intact to LT     Reflex Exam:  DTR's:    Deep tendon reflexes in the upper and lower extremities are normal bilaterally.   Toes:    The toes are downgoing bilaterally.   Clonus:    Clonus is absent.     Assessment/Plan: 41 y.o. female here as a referral from Dr. Virgina Jock for migraines and vertigo. PMHx IBS, ADD, anxiety, HLD, obesity, HTN. Migraines started years ago. She has had 1-2 in the last 3-4 months. They are not that often. But she has significant vision changes, she loses her peripheral vision. She started having vertigo as well, worsening, not with movements of the head, sometimes associated with headache. May consider vestibular migraine. Need to rule out central cause of vertigo or a vestibular schwannoma, cerebellar infarct and for pathologies affecting the brainstem  DOING GREAT, Follow up one year  MRi of the brain unremarkable Doing great on Onzetra, continue Discussed migraines and the pathophysiology Continue Onzetra(triptan) for acute management of migraines and vertiginous episodes. May repeat in 2 hours. The most common side-effects are feeling sick (nausea), dizziness and dry mouth. In addition, triptans can also cause some people to experience strange sensations. These may be a tightness, tingling, flushing, and feelings of heaviness or pressure in areas such as the face and limbs, and occasionally the chest. Serious side effects can include stroke, cardiac side effects such as chest tightness, shortness of breath and possible cardiovascular adverse effects. Zofran for nausea  CC: Dr. Radene Knee and Dr. Baird Cancer, MD  [facility] [address] [place], [place] [address]  Phone [phone] Fax [phone]  A total of 10 minutes was spent face-to-face with this patient. Over half this time was spent on counseling patient on the migraine diagnosis and  different diagnostic and therapeutic options available.

## 2017-06-02 DIAGNOSIS — Z01419 Encounter for gynecological examination (general) (routine) without abnormal findings: Secondary | ICD-10-CM | POA: Diagnosis not present

## 2017-06-03 ENCOUNTER — Other Ambulatory Visit: Payer: Self-pay | Admitting: Obstetrics and Gynecology

## 2017-06-03 DIAGNOSIS — Z1231 Encounter for screening mammogram for malignant neoplasm of breast: Secondary | ICD-10-CM

## 2017-06-10 ENCOUNTER — Ambulatory Visit
Admission: RE | Admit: 2017-06-10 | Discharge: 2017-06-10 | Disposition: A | Discharge status: Home / Self Care | Payer: 59 | Source: Ambulatory Visit | Attending: Obstetrics and Gynecology | Admitting: Obstetrics and Gynecology

## 2017-06-10 DIAGNOSIS — Z1231 Encounter for screening mammogram for malignant neoplasm of breast: Secondary | ICD-10-CM | POA: Diagnosis not present

## 2017-06-16 ENCOUNTER — Other Ambulatory Visit: Payer: Self-pay | Admitting: Obstetrics and Gynecology

## 2017-06-16 DIAGNOSIS — Z1501 Genetic susceptibility to malignant neoplasm of breast: Secondary | ICD-10-CM

## 2017-06-16 DIAGNOSIS — Z1509 Genetic susceptibility to other malignant neoplasm: Principal | ICD-10-CM

## 2017-07-30 ENCOUNTER — Encounter: Payer: Self-pay | Admitting: Obstetrics and Gynecology

## 2018-01-10 ENCOUNTER — Other Ambulatory Visit: Payer: 59

## 2018-01-13 ENCOUNTER — Ambulatory Visit
Admission: RE | Admit: 2018-01-13 | Discharge: 2018-01-13 | Disposition: A | Discharge status: Home / Self Care | Payer: 59 | Source: Ambulatory Visit | Attending: Obstetrics and Gynecology | Admitting: Obstetrics and Gynecology

## 2018-01-13 DIAGNOSIS — Z1509 Genetic susceptibility to other malignant neoplasm: Principal | ICD-10-CM

## 2018-01-13 DIAGNOSIS — Z1501 Genetic susceptibility to malignant neoplasm of breast: Secondary | ICD-10-CM

## 2018-01-13 MED ORDER — GADOBENATE DIMEGLUMINE 529 MG/ML IV SOLN
19.0000 mL | Freq: Once | INTRAVENOUS | Status: AC | PRN
  Administered 2018-01-13: 19 mL via INTRAVENOUS

## 2018-01-17 ENCOUNTER — Other Ambulatory Visit: Payer: Self-pay | Admitting: Obstetrics and Gynecology

## 2018-01-17 DIAGNOSIS — R9389 Abnormal findings on diagnostic imaging of other specified body structures: Secondary | ICD-10-CM

## 2018-01-26 DIAGNOSIS — N6323 Unspecified lump in the left breast, lower outer quadrant: Secondary | ICD-10-CM | POA: Diagnosis not present

## 2018-01-26 DIAGNOSIS — N6320 Unspecified lump in the left breast, unspecified quadrant: Secondary | ICD-10-CM | POA: Diagnosis not present

## 2018-01-26 DIAGNOSIS — N6011 Diffuse cystic mastopathy of right breast: Secondary | ICD-10-CM | POA: Diagnosis not present

## 2018-01-26 DIAGNOSIS — Z803 Family history of malignant neoplasm of breast: Secondary | ICD-10-CM | POA: Diagnosis not present

## 2018-01-26 DIAGNOSIS — N6459 Other signs and symptoms in breast: Secondary | ICD-10-CM | POA: Diagnosis not present

## 2018-01-26 DIAGNOSIS — R922 Inconclusive mammogram: Secondary | ICD-10-CM | POA: Diagnosis not present

## 2018-01-26 DIAGNOSIS — N6012 Diffuse cystic mastopathy of left breast: Secondary | ICD-10-CM | POA: Diagnosis not present

## 2018-01-26 DIAGNOSIS — R928 Other abnormal and inconclusive findings on diagnostic imaging of breast: Secondary | ICD-10-CM | POA: Diagnosis not present

## 2018-01-26 DIAGNOSIS — Z1239 Encounter for other screening for malignant neoplasm of breast: Secondary | ICD-10-CM | POA: Diagnosis not present

## 2018-01-27 DIAGNOSIS — N6323 Unspecified lump in the left breast, lower outer quadrant: Secondary | ICD-10-CM | POA: Diagnosis not present

## 2018-02-08 DIAGNOSIS — N6489 Other specified disorders of breast: Secondary | ICD-10-CM | POA: Diagnosis not present

## 2018-06-13 DIAGNOSIS — Z6835 Body mass index (BMI) 35.0-35.9, adult: Secondary | ICD-10-CM | POA: Diagnosis not present

## 2018-06-13 DIAGNOSIS — Z01419 Encounter for gynecological examination (general) (routine) without abnormal findings: Secondary | ICD-10-CM | POA: Diagnosis not present

## 2018-06-15 DIAGNOSIS — N921 Excessive and frequent menstruation with irregular cycle: Secondary | ICD-10-CM | POA: Diagnosis not present

## 2018-06-15 DIAGNOSIS — N924 Excessive bleeding in the premenopausal period: Secondary | ICD-10-CM | POA: Diagnosis not present

## 2018-06-16 DIAGNOSIS — D1722 Benign lipomatous neoplasm of skin and subcutaneous tissue of left arm: Secondary | ICD-10-CM | POA: Diagnosis not present

## 2018-06-16 DIAGNOSIS — D2261 Melanocytic nevi of right upper limb, including shoulder: Secondary | ICD-10-CM | POA: Diagnosis not present

## 2018-06-16 DIAGNOSIS — D2262 Melanocytic nevi of left upper limb, including shoulder: Secondary | ICD-10-CM | POA: Diagnosis not present

## 2018-07-06 ENCOUNTER — Ambulatory Visit (AMBULATORY_SURGERY_CENTER): Payer: Self-pay | Admitting: *Deleted

## 2018-07-06 VITALS — Ht 68.0 in | Wt 227.0 lb

## 2018-07-06 DIAGNOSIS — Z86010 Personal history of colonic polyps: Secondary | ICD-10-CM

## 2018-07-06 DIAGNOSIS — Z30430 Encounter for insertion of intrauterine contraceptive device: Secondary | ICD-10-CM | POA: Diagnosis not present

## 2018-07-06 MED ORDER — NA SULFATE-K SULFATE-MG SULF 17.5-3.13-1.6 GM/177ML PO SOLN: 1.0000 | Freq: Once | ORAL | 0 refills | Status: AC

## 2018-07-06 NOTE — Progress Notes (Signed)
Patient denies any allergies to eggs or soy. Patient denies any problems with anesthesia/sedation. Patient denies any oxygen use at home. Patient denies taking any diet/weight loss medications or blood thinners. EMMI education offered, pt declined. Suprep $50 coupon given to pt.  

## 2018-07-20 ENCOUNTER — Encounter: Payer: Self-pay | Admitting: Gastroenterology

## 2018-07-20 ENCOUNTER — Ambulatory Visit (AMBULATORY_SURGERY_CENTER): Payer: 59 | Admitting: Gastroenterology

## 2018-07-20 VITALS — BP 109/75 | HR 80 | Temp 99.3°F | Resp 14 | Ht 68.0 in | Wt 221.0 lb

## 2018-07-20 DIAGNOSIS — Z86010 Personal history of colonic polyps: Secondary | ICD-10-CM

## 2018-07-20 DIAGNOSIS — D122 Benign neoplasm of ascending colon: Secondary | ICD-10-CM

## 2018-07-20 DIAGNOSIS — D123 Benign neoplasm of transverse colon: Secondary | ICD-10-CM | POA: Diagnosis not present

## 2018-07-20 MED ORDER — SODIUM CHLORIDE 0.9 % IV SOLN: 500.0000 mL | Freq: Once | INTRAVENOUS | Status: DC

## 2018-07-20 NOTE — Patient Instructions (Signed)
Handout given on polyps  YOU HAD AN ENDOSCOPIC PROCEDURE TODAY AT THE [facility]:   Refer to the procedure report that was given to you for any specific questions about what was found during the examination.  If the procedure report does not answer your questions, please call your gastroenterologist to clarify.  If you requested that your care partner not be given the details of your procedure findings, then the procedure report has been included in a sealed envelope for you to review at your convenience later.  YOU SHOULD EXPECT: Some feelings of bloating in the abdomen. Passage of more gas than usual.  Walking can help get rid of the air that was put into your GI tract during the procedure and reduce the bloating. If you had a lower endoscopy (such as a colonoscopy or flexible sigmoidoscopy) you may notice spotting of blood in your stool or on the toilet paper. If you underwent a bowel prep for your procedure, you may not have a normal bowel movement for a few days.  Please Note:  You might notice some irritation and congestion in your nose or some drainage.  This is from the oxygen used during your procedure.  There is no need for concern and it should clear up in a day or so.  SYMPTOMS TO REPORT IMMEDIATELY:   Following lower endoscopy (colonoscopy or flexible sigmoidoscopy):  Excessive amounts of blood in the stool  Significant tenderness or worsening of abdominal pains  Swelling of the abdomen that is new, acute  Fever of 100F or higher    For urgent or emergent issues, a gastroenterologist can be reached at any hour by calling [phone].   DIET:  We do recommend a small meal at first, but then you may proceed to your regular diet.  Drink plenty of fluids but you should avoid alcoholic beverages for 24 hours.  ACTIVITY:  You should plan to take it easy for the rest of today and you should NOT DRIVE or use heavy machinery until tomorrow (because of the sedation  medicines used during the test).    FOLLOW UP: Our staff will call the number listed on your records the next business day following your procedure to check on you and address any questions or concerns that you may have regarding the information given to you following your procedure. If we do not reach you, we will leave a message.  However, if you are feeling well and you are not experiencing any problems, there is no need to return our call.  We will assume that you have returned to your regular daily activities without incident.  If any biopsies were taken you will be contacted by phone or by letter within the next 1-3 weeks.  Please call us at [phone] if you have not heard about the biopsies in 3 weeks.    SIGNATURES/CONFIDENTIALITY: You and/or your care partner have signed paperwork which will be entered into your electronic medical record.  These signatures attest to the fact that that the information above on your After Visit Summary has been reviewed and is understood.  Full responsibility of the confidentiality of this discharge information lies with you and/or your care-partner. 

## 2018-07-20 NOTE — Progress Notes (Signed)
Called to room to assist during endoscopic procedure.  Patient ID and intended procedure confirmed with present staff. Received instructions for my participation in the procedure from the performing physician.  

## 2018-07-20 NOTE — Progress Notes (Signed)
Pt's states no medical or surgical changes since previsit or office visit. 

## 2018-07-20 NOTE — Op Note (Signed)
[facility] Patient Name: Denise [name] Procedure Date: 07/20/2018 3:11 PM MRN: [id number] Endoscopist: Ladene Artist , MD Age: 42 Referring MD:  Date of Birth: 13-May-1976 Gender: Female Account #: [id number] Procedure:                Colonoscopy Indications:              High risk colon cancer surveillance: Personal                            history of sessile serrated colon polyp (less than                            10 mm in size) with no dysplasia Medicines:                Monitored Anesthesia Care Procedure:                Pre-Anesthesia Assessment:                           - Prior to the procedure, a History and Physical                            was performed, and patient medications and                            allergies were reviewed. The patient's tolerance of                            previous anesthesia was also reviewed. The risks                            and benefits of the procedure and the sedation                            options and risks were discussed with the patient.                            All questions were answered, and informed consent                            was obtained. Prior Anticoagulants: The patient has                            taken no previous anticoagulant or antiplatelet                            agents. ASA Grade Assessment: II - A patient with                            mild systemic disease. After reviewing the risks                            and benefits, the patient was deemed in  satisfactory condition to undergo the procedure.                           After obtaining informed consent, the colonoscope                            was passed under direct vision. Throughout the                            procedure, the patient's blood pressure, pulse, and                            oxygen saturations were monitored continuously. The                            Model PCF-[id number]  scope was introduced                            through the anus and advanced to the the cecum,                            identified by appendiceal orifice and ileocecal                            valve. The ileocecal valve, appendiceal orifice,                            and rectum were photographed. The quality of the                            bowel preparation was adequate after extensive                            lavage and suctioning. The colonoscopy was                            performed without difficulty. The patient tolerated                            the procedure well. Scope In: 3:22:15 PM Scope Out: 3:37:58 PM Scope Withdrawal Time: 0 hours 13 minutes 33 seconds  Total Procedure Duration: 0 hours 15 minutes 43 seconds  Findings:                 The perianal and digital rectal examinations were                            normal.                           Two sessile polyps were found in the hepatic                            flexure and ascending colon. The polyps were 7 to 8  mm in size. These polyps were removed with a cold                            snare. Resection and retrieval were complete.                           The exam was otherwise without abnormality on                            direct and retroflexion views. Complications:            No immediate complications. Estimated blood loss:                            None. Estimated Blood Loss:     Estimated blood loss: none. Impression:               - Two 7 to 8 mm polyps at the hepatic flexure and                            in the ascending colon, removed with a cold snare.                            Resected and retrieved.                           - The examination was otherwise normal on direct                            and retroflexion views. Recommendation:           - Repeat colonoscopy in 5 years for surveillance                            with a more extensive bowel prep.                            - Patient has a contact number available for                            emergencies. The signs and symptoms of potential                            delayed complications were discussed with the                            patient. Return to normal activities tomorrow.                            Written discharge instructions were provided to the                            patient.                           - Resume previous diet.                           -  Continue present medications.                           - Await pathology results. Ladene Artist, MD 07/20/2018 3:42:13 PM This report has been signed electronically.

## 2018-07-20 NOTE — Progress Notes (Signed)
To PACU, VSS. Report to [email or web address] 

## 2018-07-21 ENCOUNTER — Telehealth: Payer: Self-pay

## 2018-07-21 DIAGNOSIS — M5489 Other dorsalgia: Secondary | ICD-10-CM | POA: Diagnosis not present

## 2018-07-21 DIAGNOSIS — N390 Urinary tract infection, site not specified: Secondary | ICD-10-CM | POA: Diagnosis not present

## 2018-07-21 DIAGNOSIS — R109 Unspecified abdominal pain: Secondary | ICD-10-CM | POA: Diagnosis not present

## 2018-07-21 NOTE — Telephone Encounter (Signed)
  Follow up Call-  Call back number 07/20/2018  Post procedure Call Back phone  # [phone]  Permission to leave phone message Yes  Some recent data might be hidden     Patient questions:  Do you have a fever, pain , or abdominal swelling? No. Pain Score  0 *  Have you tolerated food without any problems? Yes.    Have you been able to return to your normal activities? Yes.    Do you have any questions about your discharge instructions: Diet   No. Medications  No. Follow up visit  No.  Do you have questions or concerns about your Care? No.  Actions: * If pain score is 4 or above: No action needed, pain <4.

## 2018-07-29 ENCOUNTER — Encounter: Payer: Self-pay | Admitting: Gastroenterology

## 2018-11-30 DIAGNOSIS — Z23 Encounter for immunization: Secondary | ICD-10-CM | POA: Diagnosis not present

## 2018-11-30 DIAGNOSIS — Z0000 Encounter for general adult medical examination without abnormal findings: Secondary | ICD-10-CM | POA: Diagnosis not present

## 2018-11-30 DIAGNOSIS — R82998 Other abnormal findings in urine: Secondary | ICD-10-CM | POA: Diagnosis not present

## 2018-12-05 DIAGNOSIS — N6019 Diffuse cystic mastopathy of unspecified breast: Secondary | ICD-10-CM | POA: Diagnosis not present

## 2018-12-05 DIAGNOSIS — Z1389 Encounter for screening for other disorder: Secondary | ICD-10-CM | POA: Diagnosis not present

## 2018-12-05 DIAGNOSIS — Z0000 Encounter for general adult medical examination without abnormal findings: Secondary | ICD-10-CM | POA: Diagnosis not present

## 2018-12-05 DIAGNOSIS — E7849 Other hyperlipidemia: Secondary | ICD-10-CM | POA: Diagnosis not present

## 2018-12-05 DIAGNOSIS — Z23 Encounter for immunization: Secondary | ICD-10-CM | POA: Diagnosis not present

## 2019-09-19 ENCOUNTER — Other Ambulatory Visit: Payer: Self-pay

## 2019-09-19 DIAGNOSIS — Z20822 Contact with and (suspected) exposure to covid-19: Secondary | ICD-10-CM

## 2019-09-20 LAB — NOVEL CORONAVIRUS, NAA: SARS-CoV-2, NAA: NOT DETECTED

## 2019-12-25 ENCOUNTER — Ambulatory Visit: Payer: 59 | Attending: Internal Medicine

## 2019-12-25 DIAGNOSIS — Z23 Encounter for immunization: Secondary | ICD-10-CM | POA: Insufficient documentation

## 2019-12-25 NOTE — Progress Notes (Signed)
   Covid-19 Vaccination Clinic  Name:  Denise [name]    MRN: [id number] DOB: 1976/05/08  12/25/2019  [name] was observed post Covid-19 immunization for 15 minutes without incidence. She was provided with Vaccine Information Sheet and instruction to access the V-Safe system.   [name] was instructed to call 911 with any severe reactions post vaccine: Marland Kitchen Difficulty breathing  . Swelling of your face and throat  . A fast heartbeat  . A bad rash all over your body  . Dizziness and weakness    Immunizations Administered    Name Date Dose VIS Date Route   Pfizer COVID-19 Vaccine 12/25/2019  6:47 PM 0.3 mL 10/27/2019 Intramuscular   Manufacturer: [place]   Lot: [id number]   [place]: [id number]

## 2020-01-19 ENCOUNTER — Ambulatory Visit: Payer: 59 | Attending: Internal Medicine

## 2020-01-19 DIAGNOSIS — Z23 Encounter for immunization: Secondary | ICD-10-CM

## 2020-01-19 NOTE — Progress Notes (Signed)
   Covid-19 Vaccination Clinic  Name:  Denise [name]    MRN: [id number] DOB: 1976/11/09  01/19/2020  [name] was observed post Covid-19 immunization for 15 minutes without incident. She was provided with Vaccine Information Sheet and instruction to access the V-Safe system.   [name] was instructed to call 911 with any severe reactions post vaccine: Marland Kitchen Difficulty breathing  . Swelling of face and throat  . A fast heartbeat  . A bad rash all over body  . Dizziness and weakness   Immunizations Administered    Name Date Dose VIS Date Route   Pfizer COVID-19 Vaccine 01/19/2020  6:51 PM 0.3 mL 10/27/2019 Intramuscular   Manufacturer: [place]   Lot: [id number]   [place]: [id number]

## 2021-06-19 DIAGNOSIS — D2239 Melanocytic nevi of other parts of face: Secondary | ICD-10-CM | POA: Diagnosis not present

## 2021-06-19 DIAGNOSIS — D2262 Melanocytic nevi of left upper limb, including shoulder: Secondary | ICD-10-CM | POA: Diagnosis not present

## 2021-06-19 DIAGNOSIS — L281 Prurigo nodularis: Secondary | ICD-10-CM | POA: Diagnosis not present

## 2021-06-19 DIAGNOSIS — D2261 Melanocytic nevi of right upper limb, including shoulder: Secondary | ICD-10-CM | POA: Diagnosis not present

## 2021-08-08 DIAGNOSIS — E785 Hyperlipidemia, unspecified: Secondary | ICD-10-CM | POA: Diagnosis not present

## 2021-08-08 DIAGNOSIS — E781 Pure hyperglyceridemia: Secondary | ICD-10-CM | POA: Diagnosis not present

## 2021-08-15 DIAGNOSIS — E785 Hyperlipidemia, unspecified: Secondary | ICD-10-CM | POA: Diagnosis not present

## 2021-08-15 DIAGNOSIS — Z1331 Encounter for screening for depression: Secondary | ICD-10-CM | POA: Diagnosis not present

## 2021-08-15 DIAGNOSIS — R82998 Other abnormal findings in urine: Secondary | ICD-10-CM | POA: Diagnosis not present

## 2021-08-15 DIAGNOSIS — Z23 Encounter for immunization: Secondary | ICD-10-CM | POA: Diagnosis not present

## 2021-08-15 DIAGNOSIS — Z1339 Encounter for screening examination for other mental health and behavioral disorders: Secondary | ICD-10-CM | POA: Diagnosis not present

## 2021-08-15 DIAGNOSIS — Z0000 Encounter for general adult medical examination without abnormal findings: Secondary | ICD-10-CM | POA: Diagnosis not present

## 2021-10-23 DIAGNOSIS — J029 Acute pharyngitis, unspecified: Secondary | ICD-10-CM | POA: Diagnosis not present

## 2021-10-23 DIAGNOSIS — R0981 Nasal congestion: Secondary | ICD-10-CM | POA: Diagnosis not present

## 2021-11-21 DIAGNOSIS — R002 Palpitations: Secondary | ICD-10-CM | POA: Diagnosis not present

## 2021-11-21 DIAGNOSIS — R9431 Abnormal electrocardiogram [ECG] [EKG]: Secondary | ICD-10-CM | POA: Diagnosis not present

## 2022-06-23 DIAGNOSIS — D2261 Melanocytic nevi of right upper limb, including shoulder: Secondary | ICD-10-CM | POA: Diagnosis not present

## 2022-06-23 DIAGNOSIS — D224 Melanocytic nevi of scalp and neck: Secondary | ICD-10-CM | POA: Diagnosis not present

## 2022-06-23 DIAGNOSIS — D2262 Melanocytic nevi of left upper limb, including shoulder: Secondary | ICD-10-CM | POA: Diagnosis not present

## 2022-06-23 DIAGNOSIS — L814 Other melanin hyperpigmentation: Secondary | ICD-10-CM | POA: Diagnosis not present

## 2022-08-21 DIAGNOSIS — E785 Hyperlipidemia, unspecified: Secondary | ICD-10-CM | POA: Diagnosis not present

## 2022-08-27 DIAGNOSIS — Z1339 Encounter for screening examination for other mental health and behavioral disorders: Secondary | ICD-10-CM | POA: Diagnosis not present

## 2022-08-27 DIAGNOSIS — Z1331 Encounter for screening for depression: Secondary | ICD-10-CM | POA: Diagnosis not present

## 2022-08-27 DIAGNOSIS — Z0000 Encounter for general adult medical examination without abnormal findings: Secondary | ICD-10-CM | POA: Diagnosis not present

## 2022-08-27 DIAGNOSIS — F900 Attention-deficit hyperactivity disorder, predominantly inattentive type: Secondary | ICD-10-CM | POA: Diagnosis not present

## 2022-09-01 ENCOUNTER — Other Ambulatory Visit: Payer: Self-pay | Admitting: Internal Medicine

## 2022-09-01 DIAGNOSIS — Z0000 Encounter for general adult medical examination without abnormal findings: Secondary | ICD-10-CM

## 2023-06-28 DIAGNOSIS — D224 Melanocytic nevi of scalp and neck: Secondary | ICD-10-CM | POA: Diagnosis not present

## 2023-06-28 DIAGNOSIS — D225 Melanocytic nevi of trunk: Secondary | ICD-10-CM | POA: Diagnosis not present

## 2023-06-28 DIAGNOSIS — D2272 Melanocytic nevi of left lower limb, including hip: Secondary | ICD-10-CM | POA: Diagnosis not present

## 2023-06-28 DIAGNOSIS — D2261 Melanocytic nevi of right upper limb, including shoulder: Secondary | ICD-10-CM | POA: Diagnosis not present

## 2023-08-27 DIAGNOSIS — Z1212 Encounter for screening for malignant neoplasm of rectum: Secondary | ICD-10-CM | POA: Diagnosis not present

## 2023-08-27 DIAGNOSIS — Z1389 Encounter for screening for other disorder: Secondary | ICD-10-CM | POA: Diagnosis not present

## 2023-08-27 DIAGNOSIS — E785 Hyperlipidemia, unspecified: Secondary | ICD-10-CM | POA: Diagnosis not present

## 2023-09-02 DIAGNOSIS — Z1389 Encounter for screening for other disorder: Secondary | ICD-10-CM | POA: Diagnosis not present

## 2023-09-02 DIAGNOSIS — R82998 Other abnormal findings in urine: Secondary | ICD-10-CM | POA: Diagnosis not present

## 2023-09-02 DIAGNOSIS — Z1331 Encounter for screening for depression: Secondary | ICD-10-CM | POA: Diagnosis not present

## 2023-09-02 DIAGNOSIS — Z0000 Encounter for general adult medical examination without abnormal findings: Secondary | ICD-10-CM | POA: Diagnosis not present

## 2023-09-02 DIAGNOSIS — E785 Hyperlipidemia, unspecified: Secondary | ICD-10-CM | POA: Diagnosis not present

## 2023-09-24 ENCOUNTER — Encounter: Payer: Self-pay | Admitting: Gastroenterology

## 2024-06-22 DIAGNOSIS — Z803 Family history of malignant neoplasm of breast: Secondary | ICD-10-CM | POA: Diagnosis not present

## 2024-06-22 DIAGNOSIS — Z9013 Acquired absence of bilateral breasts and nipples: Secondary | ICD-10-CM | POA: Diagnosis not present

## 2024-06-29 DIAGNOSIS — Z803 Family history of malignant neoplasm of breast: Secondary | ICD-10-CM | POA: Diagnosis not present

## 2024-06-29 DIAGNOSIS — Z9013 Acquired absence of bilateral breasts and nipples: Secondary | ICD-10-CM | POA: Diagnosis not present

## 2024-07-03 DIAGNOSIS — Z9013 Acquired absence of bilateral breasts and nipples: Secondary | ICD-10-CM | POA: Diagnosis not present

## 2024-07-03 DIAGNOSIS — Z803 Family history of malignant neoplasm of breast: Secondary | ICD-10-CM | POA: Diagnosis not present

## 2024-07-06 DIAGNOSIS — D2262 Melanocytic nevi of left upper limb, including shoulder: Secondary | ICD-10-CM | POA: Diagnosis not present

## 2024-07-06 DIAGNOSIS — D2272 Melanocytic nevi of left lower limb, including hip: Secondary | ICD-10-CM | POA: Diagnosis not present

## 2024-07-06 DIAGNOSIS — L814 Other melanin hyperpigmentation: Secondary | ICD-10-CM | POA: Diagnosis not present

## 2024-07-06 DIAGNOSIS — D225 Melanocytic nevi of trunk: Secondary | ICD-10-CM | POA: Diagnosis not present

## 2024-07-20 DIAGNOSIS — Z9013 Acquired absence of bilateral breasts and nipples: Secondary | ICD-10-CM | POA: Diagnosis not present

## 2024-07-20 DIAGNOSIS — Z803 Family history of malignant neoplasm of breast: Secondary | ICD-10-CM | POA: Diagnosis not present

## 2024-09-04 DIAGNOSIS — Z9013 Acquired absence of bilateral breasts and nipples: Secondary | ICD-10-CM | POA: Diagnosis not present

## 2024-09-04 DIAGNOSIS — Z803 Family history of malignant neoplasm of breast: Secondary | ICD-10-CM | POA: Diagnosis not present

## 2024-09-07 DIAGNOSIS — R739 Hyperglycemia, unspecified: Secondary | ICD-10-CM | POA: Diagnosis not present

## 2024-09-07 DIAGNOSIS — E785 Hyperlipidemia, unspecified: Secondary | ICD-10-CM | POA: Diagnosis not present

## 2024-09-11 DIAGNOSIS — R82998 Other abnormal findings in urine: Secondary | ICD-10-CM | POA: Diagnosis not present

## 2024-09-11 DIAGNOSIS — M5489 Other dorsalgia: Secondary | ICD-10-CM | POA: Diagnosis not present

## 2024-09-11 DIAGNOSIS — Z1331 Encounter for screening for depression: Secondary | ICD-10-CM | POA: Diagnosis not present

## 2024-09-11 DIAGNOSIS — Z0000 Encounter for general adult medical examination without abnormal findings: Secondary | ICD-10-CM | POA: Diagnosis not present

## 2024-09-11 DIAGNOSIS — Z1389 Encounter for screening for other disorder: Secondary | ICD-10-CM | POA: Diagnosis not present

## 2024-09-25 DIAGNOSIS — Z01818 Encounter for other preprocedural examination: Secondary | ICD-10-CM | POA: Diagnosis not present

## 2024-09-25 DIAGNOSIS — Z9013 Acquired absence of bilateral breasts and nipples: Secondary | ICD-10-CM | POA: Diagnosis not present

## 2024-10-09 DIAGNOSIS — E785 Hyperlipidemia, unspecified: Secondary | ICD-10-CM | POA: Diagnosis not present

## 2024-10-09 DIAGNOSIS — F909 Attention-deficit hyperactivity disorder, unspecified type: Secondary | ICD-10-CM | POA: Diagnosis not present

## 2024-10-09 DIAGNOSIS — N6091 Unspecified benign mammary dysplasia of right breast: Secondary | ICD-10-CM | POA: Diagnosis not present

## 2024-10-09 DIAGNOSIS — R030 Elevated blood-pressure reading, without diagnosis of hypertension: Secondary | ICD-10-CM | POA: Diagnosis not present

## 2024-10-09 DIAGNOSIS — Z01818 Encounter for other preprocedural examination: Secondary | ICD-10-CM | POA: Diagnosis not present

## 2024-11-01 DIAGNOSIS — Z803 Family history of malignant neoplasm of breast: Secondary | ICD-10-CM | POA: Diagnosis not present

## 2024-11-01 DIAGNOSIS — Z421 Encounter for breast reconstruction following mastectomy: Secondary | ICD-10-CM | POA: Diagnosis not present

## 2024-11-01 DIAGNOSIS — F909 Attention-deficit hyperactivity disorder, unspecified type: Secondary | ICD-10-CM | POA: Diagnosis not present

## 2024-11-01 DIAGNOSIS — Z9013 Acquired absence of bilateral breasts and nipples: Secondary | ICD-10-CM | POA: Diagnosis not present

## 2024-11-01 DIAGNOSIS — E785 Hyperlipidemia, unspecified: Secondary | ICD-10-CM | POA: Diagnosis not present

## 2024-11-01 DIAGNOSIS — N61 Mastitis without abscess: Secondary | ICD-10-CM | POA: Diagnosis not present

## 2024-11-13 DIAGNOSIS — Z9013 Acquired absence of bilateral breasts and nipples: Secondary | ICD-10-CM | POA: Diagnosis not present

## 2024-11-13 DIAGNOSIS — Z803 Family history of malignant neoplasm of breast: Secondary | ICD-10-CM | POA: Diagnosis not present
